# Patient Record
Sex: Female | Born: 1975 | Hispanic: No | Marital: Single | Smoking: Never smoker
Health system: Southern US, Community
[De-identification: ages and names within clinical notes are randomized; demographics above are authoritative.]

## PROBLEM LIST (undated history)

## (undated) DIAGNOSIS — J45909 Unspecified asthma, uncomplicated: Secondary | ICD-10-CM

## (undated) DIAGNOSIS — E119 Type 2 diabetes mellitus without complications: Secondary | ICD-10-CM

## (undated) DIAGNOSIS — K760 Fatty (change of) liver, not elsewhere classified: Secondary | ICD-10-CM

## (undated) HISTORY — DX: Type 2 diabetes mellitus without complications: E11.9

## (undated) HISTORY — DX: Fatty (change of) liver, not elsewhere classified: K76.0

---

## 1997-05-22 ENCOUNTER — Inpatient Hospital Stay (HOSPITAL_COMMUNITY): Admission: EM | Admit: 1997-05-22 | Discharge: 1997-05-22 | Payer: Self-pay | Admitting: Obstetrics

## 1998-01-10 ENCOUNTER — Emergency Department (HOSPITAL_COMMUNITY): Admission: EM | Admit: 1998-01-10 | Discharge: 1998-01-11 | Payer: Self-pay | Admitting: Emergency Medicine

## 1998-01-13 ENCOUNTER — Emergency Department (HOSPITAL_COMMUNITY): Admission: EM | Admit: 1998-01-13 | Discharge: 1998-01-13 | Payer: Self-pay | Admitting: Emergency Medicine

## 1998-09-08 ENCOUNTER — Ambulatory Visit (HOSPITAL_COMMUNITY): Admission: RE | Admit: 1998-09-08 | Discharge: 1998-09-08 | Payer: Self-pay | Admitting: Gynecology

## 1998-10-01 ENCOUNTER — Other Ambulatory Visit: Admission: RE | Admit: 1998-10-01 | Discharge: 1998-10-01 | Payer: Self-pay | Admitting: Gynecology

## 1999-07-20 ENCOUNTER — Emergency Department (HOSPITAL_COMMUNITY): Admission: EM | Admit: 1999-07-20 | Discharge: 1999-07-20 | Payer: Self-pay | Admitting: Emergency Medicine

## 1999-07-20 ENCOUNTER — Encounter: Payer: Self-pay | Admitting: Emergency Medicine

## 1999-08-28 ENCOUNTER — Other Ambulatory Visit: Admission: RE | Admit: 1999-08-28 | Discharge: 1999-08-28 | Payer: Self-pay | Admitting: Gynecology

## 1999-10-27 ENCOUNTER — Encounter: Admission: RE | Admit: 1999-10-27 | Discharge: 1999-10-27 | Payer: Self-pay | Admitting: Family Medicine

## 1999-11-06 ENCOUNTER — Encounter: Admission: RE | Admit: 1999-11-06 | Discharge: 2000-02-04 | Payer: Self-pay | Admitting: Internal Medicine

## 2000-02-10 ENCOUNTER — Inpatient Hospital Stay (HOSPITAL_COMMUNITY): Admission: AD | Admit: 2000-02-10 | Discharge: 2000-02-12 | Payer: Self-pay | Admitting: *Deleted

## 2000-03-25 ENCOUNTER — Other Ambulatory Visit: Admission: RE | Admit: 2000-03-25 | Discharge: 2000-03-25 | Payer: Self-pay | Admitting: Gynecology

## 2001-03-31 ENCOUNTER — Other Ambulatory Visit: Admission: RE | Admit: 2001-03-31 | Discharge: 2001-03-31 | Payer: Self-pay | Admitting: Gynecology

## 2001-10-06 ENCOUNTER — Other Ambulatory Visit: Admission: RE | Admit: 2001-10-06 | Discharge: 2001-10-06 | Payer: Self-pay | Admitting: Obstetrics and Gynecology

## 2002-04-12 ENCOUNTER — Other Ambulatory Visit: Admission: RE | Admit: 2002-04-12 | Discharge: 2002-04-12 | Payer: Self-pay | Admitting: Gynecology

## 2003-09-19 ENCOUNTER — Emergency Department (HOSPITAL_COMMUNITY): Admission: EM | Admit: 2003-09-19 | Discharge: 2003-09-19 | Payer: Self-pay | Admitting: Emergency Medicine

## 2011-09-22 ENCOUNTER — Ambulatory Visit
Admission: RE | Admit: 2011-09-22 | Discharge: 2011-09-22 | Disposition: A | Discharge status: Home / Self Care | Payer: No Typology Code available for payment source | Source: Ambulatory Visit | Attending: Geriatric Medicine | Admitting: Geriatric Medicine

## 2011-09-22 ENCOUNTER — Other Ambulatory Visit: Payer: Self-pay | Admitting: Geriatric Medicine

## 2011-09-22 DIAGNOSIS — M79673 Pain in unspecified foot: Secondary | ICD-10-CM

## 2018-09-11 ENCOUNTER — Other Ambulatory Visit: Payer: Self-pay

## 2018-09-11 ENCOUNTER — Inpatient Hospital Stay (HOSPITAL_COMMUNITY)
Admission: EM | Admit: 2018-09-11 | Discharge: 2018-09-15 | DRG: 177 | Disposition: A | Discharge status: Home / Self Care | Payer: HRSA Program | Attending: Internal Medicine | Admitting: Internal Medicine

## 2018-09-11 ENCOUNTER — Encounter (HOSPITAL_COMMUNITY): Payer: Self-pay | Admitting: Emergency Medicine

## 2018-09-11 ENCOUNTER — Emergency Department (HOSPITAL_COMMUNITY): Payer: HRSA Program

## 2018-09-11 DIAGNOSIS — A419 Sepsis, unspecified organism: Secondary | ICD-10-CM

## 2018-09-11 DIAGNOSIS — R739 Hyperglycemia, unspecified: Secondary | ICD-10-CM | POA: Diagnosis not present

## 2018-09-11 DIAGNOSIS — J9601 Acute respiratory failure with hypoxia: Secondary | ICD-10-CM | POA: Diagnosis present

## 2018-09-11 DIAGNOSIS — J1289 Other viral pneumonia: Secondary | ICD-10-CM | POA: Diagnosis not present

## 2018-09-11 DIAGNOSIS — E1165 Type 2 diabetes mellitus with hyperglycemia: Secondary | ICD-10-CM | POA: Diagnosis present

## 2018-09-11 DIAGNOSIS — J1282 Pneumonia due to coronavirus disease 2019: Secondary | ICD-10-CM | POA: Diagnosis present

## 2018-09-11 DIAGNOSIS — J45909 Unspecified asthma, uncomplicated: Secondary | ICD-10-CM | POA: Diagnosis not present

## 2018-09-11 DIAGNOSIS — Z8709 Personal history of other diseases of the respiratory system: Secondary | ICD-10-CM | POA: Diagnosis not present

## 2018-09-11 DIAGNOSIS — U071 COVID-19: Principal | ICD-10-CM | POA: Diagnosis present

## 2018-09-11 DIAGNOSIS — R6510 Systemic inflammatory response syndrome (SIRS) of non-infectious origin without acute organ dysfunction: Secondary | ICD-10-CM | POA: Diagnosis not present

## 2018-09-11 HISTORY — DX: Unspecified asthma, uncomplicated: J45.909

## 2018-09-11 LAB — CBG MONITORING, ED: Glucose-Capillary: 184 mg/dL — ABNORMAL HIGH (ref 70–99)

## 2018-09-11 LAB — C-REACTIVE PROTEIN
CRP: 17.1 mg/dL — ABNORMAL HIGH (ref <1.0)
CRP: 15.6 mg/dL — ABNORMAL HIGH (ref <1.0)

## 2018-09-11 LAB — PROCALCITONIN
Procalcitonin: 0.23 ng/mL
Procalcitonin: 0.17 ng/mL

## 2018-09-11 LAB — CBC
HCT: 40 % (ref 36.0–46.0)
Hemoglobin: 13.1 g/dL (ref 12.0–15.0)
MCH: 29.8 pg (ref 26.0–34.0)
MCHC: 32.8 g/dL (ref 30.0–36.0)
MCV: 91.1 fL (ref 80.0–100.0)
Platelets: 276 10*3/uL (ref 150–400)
RBC: 4.39 MIL/uL (ref 3.87–5.11)
RDW: 13.5 % (ref 11.5–15.5)
WBC: 10.9 10*3/uL — ABNORMAL HIGH (ref 4.0–10.5)
nRBC: 0 % (ref 0.0–0.2)

## 2018-09-11 LAB — I-STAT BETA HCG BLOOD, ED (MC, WL, AP ONLY): I-stat hCG, quantitative: <5 m[IU]/mL (ref <5)

## 2018-09-11 LAB — TYPE AND SCREEN
ABO/RH(D): B POS
Antibody Screen: NEGATIVE

## 2018-09-11 LAB — ABO/RH: ABO/RH(D): B POS

## 2018-09-11 LAB — BASIC METABOLIC PANEL
Anion gap: 12 (ref 5–15)
BUN: 7 mg/dL (ref 6–20)
CO2: 27 mmol/L (ref 22–32)
Calcium: 8.6 mg/dL — ABNORMAL LOW (ref 8.9–10.3)
Chloride: 97 mmol/L — ABNORMAL LOW (ref 98–111)
Creatinine, Ser: 0.64 mg/dL (ref 0.44–1.00)
GFR calc Af Amer: >60 mL/min (ref >60)
GFR calc non Af Amer: >60 mL/min (ref >60)
Glucose, Bld: 176 mg/dL — ABNORMAL HIGH (ref 70–99)
Potassium: 4.3 mmol/L (ref 3.5–5.1)
Sodium: 136 mmol/L (ref 135–145)

## 2018-09-11 LAB — TRIGLYCERIDES
Triglycerides: 91 mg/dL (ref <150)
Triglycerides: 89 mg/dL (ref <150)

## 2018-09-11 LAB — SEDIMENTATION RATE: Sed Rate: 116 mm/hr — ABNORMAL HIGH (ref 0–22)

## 2018-09-11 LAB — LACTATE DEHYDROGENASE: LDH: 328 U/L — ABNORMAL HIGH (ref 98–192)

## 2018-09-11 LAB — LACTIC ACID, PLASMA
Lactic Acid, Venous: 1.8 mmol/L (ref 0.5–1.9)
Lactic Acid, Venous: 1.2 mmol/L (ref 0.5–1.9)

## 2018-09-11 LAB — TROPONIN I (HIGH SENSITIVITY)
Troponin I (High Sensitivity): 5 ng/L (ref <18)
Troponin I (High Sensitivity): 5 ng/L (ref <18)

## 2018-09-11 LAB — FIBRINOGEN
Fibrinogen: >800 mg/dL — ABNORMAL HIGH (ref 210–475)
Fibrinogen: >800 mg/dL — ABNORMAL HIGH (ref 210–475)

## 2018-09-11 LAB — BRAIN NATRIURETIC PEPTIDE: B Natriuretic Peptide: 45.1 pg/mL (ref 0.0–100.0)

## 2018-09-11 LAB — SARS CORONAVIRUS 2 BY RT PCR (HOSPITAL ORDER, PERFORMED IN ~~LOC~~ HOSPITAL LAB): SARS Coronavirus 2: POSITIVE — AB

## 2018-09-11 LAB — D-DIMER, QUANTITATIVE
D-Dimer, Quant: 0.89 ug/mL-FEU — ABNORMAL HIGH (ref 0.00–0.50)
D-Dimer, Quant: 0.68 ug/mL-FEU — ABNORMAL HIGH (ref 0.00–0.50)

## 2018-09-11 LAB — FERRITIN: Ferritin: 390 ng/mL — ABNORMAL HIGH (ref 11–307)

## 2018-09-11 LAB — GLUCOSE, CAPILLARY: Glucose-Capillary: 210 mg/dL — ABNORMAL HIGH (ref 70–99)

## 2018-09-11 MED ORDER — GUAIFENESIN-DM 100-10 MG/5ML PO SYRP
10.0000 mL | ORAL_SOLUTION | ORAL | Status: DC | PRN
  Administered 2018-09-11: 10 mL via ORAL
  Filled 2018-09-11: qty 10

## 2018-09-11 MED ORDER — ALBUTEROL SULFATE HFA 108 (90 BASE) MCG/ACT IN AERS
2.0000 | INHALATION_SPRAY | Freq: Once | RESPIRATORY_TRACT | Status: AC
  Administered 2018-09-11: 2 via RESPIRATORY_TRACT

## 2018-09-11 MED ORDER — VITAMIN C 500 MG PO TABS
500.0000 mg | ORAL_TABLET | Freq: Every day | ORAL | Status: DC
  Administered 2018-09-12 – 2018-09-15 (×4): 500 mg via ORAL
  Filled 2018-09-11 (×4): qty 1

## 2018-09-11 MED ORDER — SODIUM CHLORIDE 0.9 % IV SOLN
1.0000 g | Freq: Once | INTRAVENOUS | Status: AC
  Administered 2018-09-11: 1 g via INTRAVENOUS
  Filled 2018-09-11: qty 10

## 2018-09-11 MED ORDER — ALBUTEROL SULFATE HFA 108 (90 BASE) MCG/ACT IN AERS
2.0000 | INHALATION_SPRAY | Freq: Four times a day (QID) | RESPIRATORY_TRACT | Status: DC
  Administered 2018-09-11: 2 via RESPIRATORY_TRACT
  Filled 2018-09-11: qty 6.7

## 2018-09-11 MED ORDER — SODIUM CHLORIDE 0.9 % IV SOLN
200.0000 mg | Freq: Once | INTRAVENOUS | Status: AC
  Administered 2018-09-11: 200 mg via INTRAVENOUS
  Filled 2018-09-11: qty 40

## 2018-09-11 MED ORDER — SODIUM CHLORIDE 0.9 % IV SOLN
500.0000 mg | Freq: Once | INTRAVENOUS | Status: AC
  Administered 2018-09-11: 500 mg via INTRAVENOUS
  Filled 2018-09-11: qty 500

## 2018-09-11 MED ORDER — ENOXAPARIN SODIUM 40 MG/0.4ML ~~LOC~~ SOLN
40.0000 mg | SUBCUTANEOUS | Status: DC
  Administered 2018-09-12 – 2018-09-15 (×4): 40 mg via SUBCUTANEOUS
  Filled 2018-09-11 (×3): qty 0.4

## 2018-09-11 MED ORDER — SODIUM CHLORIDE 0.9 % IV SOLN
100.0000 mg | INTRAVENOUS | Status: AC
  Administered 2018-09-12 – 2018-09-15 (×4): 100 mg via INTRAVENOUS
  Filled 2018-09-11 (×4): qty 20

## 2018-09-11 MED ORDER — ALBUTEROL SULFATE HFA 108 (90 BASE) MCG/ACT IN AERS
INHALATION_SPRAY | RESPIRATORY_TRACT | Status: AC
  Administered 2018-09-11: 09:00:00
  Filled 2018-09-11: qty 6.7

## 2018-09-11 MED ORDER — SODIUM CHLORIDE 0.9% FLUSH: 3.0000 mL | Freq: Once | INTRAVENOUS | Status: DC

## 2018-09-11 MED ORDER — HYDROCOD POLST-CPM POLST ER 10-8 MG/5ML PO SUER: 5.0000 mL | Freq: Two times a day (BID) | ORAL | Status: DC | PRN

## 2018-09-11 MED ORDER — INSULIN ASPART 100 UNIT/ML ~~LOC~~ SOLN
0.0000 [IU] | Freq: Three times a day (TID) | SUBCUTANEOUS | Status: DC
  Administered 2018-09-11: 2 [IU] via SUBCUTANEOUS

## 2018-09-11 MED ORDER — ONDANSETRON HCL 4 MG/2ML IJ SOLN: 4.0000 mg | Freq: Four times a day (QID) | INTRAMUSCULAR | Status: DC | PRN

## 2018-09-11 MED ORDER — SODIUM CHLORIDE 0.9% FLUSH
3.0000 mL | Freq: Two times a day (BID) | INTRAVENOUS | Status: DC
  Administered 2018-09-11: 3 mL via INTRAVENOUS

## 2018-09-11 MED ORDER — IPRATROPIUM BROMIDE HFA 17 MCG/ACT IN AERS
2.0000 | INHALATION_SPRAY | Freq: Once | RESPIRATORY_TRACT | Status: AC
  Administered 2018-09-11: 2 via RESPIRATORY_TRACT
  Filled 2018-09-11: qty 12.9

## 2018-09-11 MED ORDER — FAMOTIDINE 20 MG PO TABS
20.0000 mg | ORAL_TABLET | Freq: Two times a day (BID) | ORAL | Status: DC
  Administered 2018-09-11 – 2018-09-15 (×9): 20 mg via ORAL
  Filled 2018-09-11 (×9): qty 1

## 2018-09-11 MED ORDER — IPRATROPIUM-ALBUTEROL 20-100 MCG/ACT IN AERS
1.0000 | INHALATION_SPRAY | Freq: Four times a day (QID) | RESPIRATORY_TRACT | Status: DC
  Administered 2018-09-11 – 2018-09-12 (×2): 1 via RESPIRATORY_TRACT
  Filled 2018-09-11: qty 4

## 2018-09-11 MED ORDER — ONDANSETRON HCL 4 MG PO TABS: 4.0000 mg | ORAL_TABLET | Freq: Four times a day (QID) | ORAL | Status: DC | PRN

## 2018-09-11 MED ORDER — INSULIN ASPART 100 UNIT/ML ~~LOC~~ SOLN
0.0000 [IU] | SUBCUTANEOUS | Status: DC
  Administered 2018-09-11: 5 [IU] via SUBCUTANEOUS
  Administered 2018-09-12: 3 [IU] via SUBCUTANEOUS

## 2018-09-11 MED ORDER — SODIUM CHLORIDE 0.9 % IV SOLN
Freq: Once | INTRAVENOUS | Status: AC
  Administered 2018-09-11: 12:00:00 via INTRAVENOUS

## 2018-09-11 MED ORDER — ZINC SULFATE 220 (50 ZN) MG PO CAPS
220.0000 mg | ORAL_CAPSULE | Freq: Every day | ORAL | Status: DC
  Administered 2018-09-11 – 2018-09-15 (×5): 220 mg via ORAL
  Filled 2018-09-11 (×5): qty 1

## 2018-09-11 MED ORDER — ALBUTEROL SULFATE HFA 108 (90 BASE) MCG/ACT IN AERS
8.0000 | INHALATION_SPRAY | Freq: Once | RESPIRATORY_TRACT | Status: AC
  Administered 2018-09-11: 8 via RESPIRATORY_TRACT

## 2018-09-11 MED ORDER — DEXAMETHASONE SODIUM PHOSPHATE 10 MG/ML IJ SOLN
6.0000 mg | INTRAMUSCULAR | Status: AC
  Administered 2018-09-11: 6 mg via INTRAVENOUS
  Filled 2018-09-11: qty 1

## 2018-09-11 MED ORDER — TOCILIZUMAB 400 MG/20ML IV SOLN
8.0000 mg/kg | Freq: Once | INTRAVENOUS | Status: AC
  Administered 2018-09-11: 730 mg via INTRAVENOUS
  Filled 2018-09-11: qty 36.5

## 2018-09-11 MED ORDER — MONTELUKAST SODIUM 10 MG PO TABS
10.0000 mg | ORAL_TABLET | Freq: Every day | ORAL | Status: DC
  Administered 2018-09-11 – 2018-09-14 (×4): 10 mg via ORAL
  Filled 2018-09-11 (×4): qty 1

## 2018-09-11 MED ORDER — METHYLPREDNISOLONE SODIUM SUCC 125 MG IJ SOLR
60.0000 mg | Freq: Three times a day (TID) | INTRAMUSCULAR | Status: DC
  Administered 2018-09-11 – 2018-09-12 (×2): 60 mg via INTRAVENOUS
  Filled 2018-09-11 (×2): qty 2

## 2018-09-11 NOTE — ED Notes (Signed)
ED Provider at bedside. 

## 2018-09-11 NOTE — ED Triage Notes (Signed)
Pt here for eval of tightness to chest with breathing, hx of asthma. Afebrile, denies exposure to covid. Pt 86% on room air, using her inhaler with no relief. Placed on 2L O2. Endorses cough and SOB.

## 2018-09-11 NOTE — ED Provider Notes (Signed)
[facility] EMERGENCY DEPARTMENT Provider Note   CSN: [id number] Arrival date & time: 09/11/18  0840     History   Chief Complaint Chief Complaint  Patient presents with  . Chest Pain  . Shortness of Breath    HPI Randall Laiyla Slagel is a 43 y.o. female.     HPI  43 year old female presents with shortness of breath and cough.  Symptoms overall started about a week ago.  Cough is a dry cough and it causes anterior chest pain when she coughs.  She states she was told last year she might have asthma and thinks this might be similar.  She has not heard any wheezing.  A few days ago she went somewhere else and was given what sounds like a nebulizer but it only partially helped.  She feels like her legs have been a little swollen.  She denies any sick contacts. On arrival to triage she is hypoxic to 86%.  Past Medical History:  Diagnosis Date  . Asthma     There are no active problems to display for this patient.      OB History   No obstetric history on file.      Home Medications    Prior to Admission medications   Not on File    Family History No family history on file.  Social History Social History   Tobacco Use  . Smoking status: Not on file  Substance Use Topics  . Alcohol use: Not on file  . Drug use: Not on file     Allergies   Patient has no known allergies.   Review of Systems Review of Systems  Constitutional: Negative for fever.  Respiratory: Positive for cough and shortness of breath. Negative for wheezing.   Cardiovascular: Positive for chest pain and leg swelling.  Gastrointestinal: Negative for vomiting.  All other systems reviewed and are negative.    Physical Exam Updated Vital Signs BP 134/72   Pulse 84   Temp 98.6 F (37 C)   Resp (!) 24   SpO2 (!) 86%   Physical Exam Vitals signs and nursing note reviewed.  Constitutional:      Appearance: She is well-developed. She is not ill-appearing or  diaphoretic.  HENT:     Head: Normocephalic and atraumatic.     Right Ear: External ear normal.     Left Ear: External ear normal.     Nose: Nose normal.  Eyes:     General:        Right eye: No discharge.        Left eye: No discharge.  Cardiovascular:     Rate and Rhythm: Regular rhythm. Tachycardia present.     Heart sounds: Normal heart sounds.  Pulmonary:     Effort: Tachypnea present. No accessory muscle usage or respiratory distress.     Breath sounds: Examination of the right-lower field reveals rales. Examination of the left-lower field reveals rales. Rales present.  Abdominal:     Palpations: Abdomen is soft.     Tenderness: There is no abdominal tenderness.  Musculoskeletal:     Right lower leg: No edema.     Left lower leg: No edema.  Skin:    General: Skin is warm and dry.  Neurological:     Mental Status: She is alert.  Psychiatric:        Mood and Affect: Mood is not anxious.      ED Treatments / Results  Labs (all  labs ordered are listed, but only abnormal results are displayed) Labs Reviewed  SARS CORONAVIRUS 2 (HOSPITAL ORDER, PERFORMED IN [facility]) - Abnormal; Notable for the following components:      Result Value   SARS Coronavirus 2 POSITIVE (*)    All other components within normal limits  BASIC METABOLIC PANEL - Abnormal; Notable for the following components:   Chloride 97 (*)    Glucose, Bld 176 (*)    Calcium 8.6 (*)    All other components within normal limits  CBC - Abnormal; Notable for the following components:   WBC 10.9 (*)    All other components within normal limits  CULTURE, BLOOD (ROUTINE X 2)  CULTURE, BLOOD (ROUTINE X 2)  EXPECTORATED SPUTUM ASSESSMENT W REFEX TO RESP CULTURE  BRAIN NATRIURETIC PEPTIDE  LACTIC ACID, PLASMA  LACTIC ACID, PLASMA  D-DIMER, QUANTITATIVE (NOT AT [facility])  PROCALCITONIN  FERRITIN  TRIGLYCERIDES  FIBRINOGEN  C-REACTIVE PROTEIN  LACTATE DEHYDROGENASE  HIV ANTIBODY (ROUTINE TESTING  W REFLEX)  C-REACTIVE PROTEIN  D-DIMER, QUANTITATIVE (NOT AT [facility])  FIBRINOGEN  GLUCOSE 6 PHOSPHATE DEHYDROGENASE  PROCALCITONIN  TRIGLYCERIDES  SEDIMENTATION RATE  LACTATE DEHYDROGENASE  INTERLEUKIN-6, PLASMA  HEPATITIS B SURFACE ANTIGEN  LEGIONELLA PNEUMOPHILA SEROGP 1 UR AG  STREP PNEUMONIAE URINARY ANTIGEN  I-STAT BETA HCG BLOOD, ED ([facility], [facility], [facility] ONLY)  ABO/RH  TYPE AND SCREEN  TROPONIN I (HIGH SENSITIVITY)  TROPONIN I (HIGH SENSITIVITY)    EKG EKG Interpretation  Date/Time:  Monday September 11 2018 08:50:29 EDT Ventricular Rate:  103 PR Interval:  142 QRS Duration: 70 QT Interval:  332 QTC Calculation: 434 R Axis:   56 Text Interpretation:  Sinus tachycardia no acute ST/T changes No old tracing to compare Confirmed by Pricilla LovelessGoldston, Khanh Cordner [phone]([id number]) on 09/11/2018 9:01:22 AM   Radiology Dg Chest Portable 1 View  Result Date: 09/11/2018 CLINICAL DATA:  Hypoxia.  Chest tightness.  History of asthma. EXAM: PORTABLE CHEST 1 VIEW COMPARISON:  None. FINDINGS: Mild patchy opacities in the bases, left greater than right. No pneumothorax. The cardiomediastinal silhouette is unremarkable. No other acute abnormalities. IMPRESSION: Mild patchy opacities in the bases, left greater than right, may represent developing pneumonia. Atypical infections should be considered. No other acute abnormalities are identified. Electronically Signed   By: Gerome Samavid  Williams III M.D   On: 09/11/2018 09:41    Procedures Procedures (including critical care time)  Medications Ordered in ED Medications  sodium chloride flush (NS) 0.9 % injection 3 mL (3 mLs Intravenous Not Given 09/11/18 1031)  azithromycin (ZITHROMAX) 500 mg in sodium chloride 0.9 % 250 mL IVPB (500 mg Intravenous New Bag/Given 09/11/18 1043)  montelukast (SINGULAIR) tablet 10 mg (has no administration in time range)  enoxaparin (LOVENOX) injection 40 mg (has no administration in time range)  sodium chloride flush (NS) 0.9 % injection 3 mL (has no  administration in time range)  0.9 %  sodium chloride infusion (has no administration in time range)  albuterol (VENTOLIN HFA) 108 (90 Base) MCG/ACT inhaler 2 puff (has no administration in time range)  guaiFENesin-dextromethorphan (ROBITUSSIN DM) 100-10 MG/5ML syrup 10 mL (has no administration in time range)  chlorpheniramine-HYDROcodone (TUSSIONEX) 10-8 MG/5ML suspension 5 mL (has no administration in time range)  vitamin C (ASCORBIC ACID) tablet 500 mg (has no administration in time range)  zinc sulfate capsule 220 mg (has no administration in time range)  famotidine (PEPCID) tablet 20 mg (has no administration in time range)  ondansetron (ZOFRAN) tablet 4 mg (has  no administration in time range)    Or  ondansetron (ZOFRAN) injection 4 mg (has no administration in time range)  albuterol (VENTOLIN HFA) 108 (90 Base) MCG/ACT inhaler 2 puff (2 puffs Inhalation Given 09/11/18 0858)  albuterol (VENTOLIN HFA) 108 (90 Base) MCG/ACT inhaler (  Given 09/11/18 0911)  albuterol (VENTOLIN HFA) 108 (90 Base) MCG/ACT inhaler 8 puff (8 puffs Inhalation Given 09/11/18 0914)  ipratropium (ATROVENT HFA) inhaler 2 puff (2 puffs Inhalation Given 09/11/18 1030)  cefTRIAXone (ROCEPHIN) 1 g in sodium chloride 0.9 % 100 mL IVPB (0 g Intravenous Stopped 09/11/18 1042)     Initial Impression / Assessment and Plan / ED Course  I have reviewed the triage vital signs and the nursing notes.  Pertinent labs & imaging results that were available during my care of the patient were reviewed by me and considered in my medical decision making (see chart for details).        Patient test positive for the novel coronavirus.  This explains her hypoxia and chest x-ray findings.  Prior to this test coming back, she was given antibiotics for possible pneumonia but it appears that she has a viral pneumonia and these antibiotics can likely be stopped.  Otherwise, she is tachypneic but not in distress.  She is on supplemental oxygen  but is protecting her airway and does not need further airway management at this time.  Discussed with Dr. Katrinka BlazingSmith, who will admit.  Lynford CitizenReyna Gonzalez Eves was evaluated in Emergency Department on 09/11/2018 for the symptoms described in the history of present illness. She was evaluated in the context of the global COVID-19 pandemic, which necessitated consideration that the patient might be at risk for infection with the SARS-CoV-2 virus that causes COVID-19. Institutional protocols and algorithms that pertain to the evaluation of patients at risk for COVID-19 are in a state of rapid change based on information released by regulatory bodies including the CDC and federal and state organizations. These policies and algorithms were followed during the patient's care in the ED.   Final Clinical Impressions(s) / ED Diagnoses   Final diagnoses:  COVID-19 virus infection  Acute respiratory failure with hypoxia [facility](HCC)    ED Discharge Orders    None       Pricilla LovelessGoldston, Angeletta Goelz, MD 09/11/18 1143

## 2018-09-11 NOTE — ED Notes (Signed)
Pt's CBG result was 184. Informed Cindy - RN.

## 2018-09-11 NOTE — Progress Notes (Signed)
Ruth Fox DOB: 11-17-1975 DOA: 09/11/2018 PCP: Patient, No Pcp Per   [place]:  Ruth Fox is a 43 y.o HF PMHx Asthma; diabetes type 2 uncontrolled with complication  Presents with 1 week of progressively worsening cough and shortness of breath.  Her cough has been mostly nonproductive.  Couple days ago she went somewhere and possibly received a nebulized treatment which helped some with the symptoms.  Only other associated symptoms include some malaise, chest discomfort with coughing, and some mild lower extremity swelling.  Denies having any significant fever, chills, nausea, vomiting, diarrhea, headache, muscle aches.  She does not report any recent sick contacts to her knowledge.  She reports a history of asthma, but reports not being on any inhalers at home.  She is unsure why she was started on metformin.   ED Course: On admission into the emergency department patient was noted to be afebrile, respirations up to 38, blood pressure 102/59-134/72, SPO2  86% on room air and improved to greater than 92% on 2 L nasal cannula oxygen, and heart rates maintained. Labs revealed WBC 10.9, lactic acid 1.8, high-sensitivity troponin negative, and BNP 45.  Chest x-ray showed signs of bilateral opacities concerning for pneumonia.  COVID-19 screening was positive.  Patient was given albuterol, ipratropium, empiric antibiotics of Rocephin, and azithromycin. [facility] called to admit.  States lives with her husband and her child neither which have been tested for COVID. - Negative history of hepatitis or TB  Obj: Objective: VITAL SIGNS: Temp: 98.7 F (37.1 C) (07/20 1900) Temp Source: Oral (07/20 1900) BP: 109/61 (07/20 2145) Pulse Rate: 95 (07/20 2145) SPO2; 95% FIO2: 5 L O2   Intake/Output Summary (Last 24 hours) at 09/11/2018 2211 Last data filed at 09/11/2018 1510 Gross per 24 hour  Intake 250 ml  Output -  Net 250 ml     Exam: General: A/O x4, positive acute  respiratory distress Lungs: Clear to auscultation bilaterally, bibasilar crackles, negative wheezes Cardiovascular: Regular rate and rhythm without murmur gallop or rub normal S1 and S2 Abdomen: Obese, nontender, nondistended, soft, bowel sounds positive, no rebound, no ascites, no appreciable mass Extremities: No significant cyanosis, clubbing, or edema bilateral lower extremities Skin: Negative rashes, lesions, ulcers Psychiatric:  Negative depression, negative anxiety, negative fatigue, negative mania  Central nervous system:  Cranial nerves II through XII intact, tongue/uvula midline, all extremities muscle strength 5/5, sensation intact throughout,  negative dysarthria, negative expressive aphasia, negative receptive aphasia.  .     Procedure/Significant Events: 7/20 PCXR, bibasilar patchy opacifications LEFT> RIGHT 7/20 urine hCG pending   I have personally reviewed and interpreted all radiology studies and my findings are as above.   Culture 7/20 urine Legionella and strep urine antigen pending 7/20 blood pending 7/20 respiratory virus panel pending 7/20 SARS coronavirus positive    Antibiotics: Anti-infectives (From admission, onward)   Start     Stop   09/12/18 1000  remdesivir 100 mg in sodium chloride 0.9 % 250 mL IVPB     09/16/18 0959   09/11/18 1400  remdesivir 200 mg in sodium chloride 0.9 % 250 mL IVPB     09/11/18 1510   09/11/18 1000  cefTRIAXone (ROCEPHIN) 1 g in sodium chloride 0.9 % 100 mL IVPB     09/11/18 1042   09/11/18 1000  azithromycin (ZITHROMAX) 500 mg in sodium chloride 0.9 % 250 mL IVPB     09/11/18 1158        Principal Problem:   Acute  respiratory failure with hypoxia (HCC) Active Problems:   Pneumonia due to COVID-19 virus   Sepsis (HCC)   History of asthma   Hyperglycemia   A/P  Acute respiratory failure with hypoxia/COVID 19 pneumonia positive Recent Labs  Lab 09/11/18 0916 09/11/18 1215  CRP 17.1* 15.6*   Recent Labs   Lab 09/11/18 0916 09/11/18 1215  DDIMER 0.89* 0.68*  - Patient not on home O2 - 7/20 currently on 5 L O2 via [place]: SPO2 93%.  Titrate O2 to maintain SPO2> 90% - Given patient's PCXR and new O2 demand patient meets criteria for high-dose steroids, and Remdesivir per pharmacy  -Solu-Medrol 60 mg every 8 hours - Given patient's PCXR and CRP> 7 patient meets criteria for Actemra - COVID daily inflammatory markers ordered  Sepsis - Upon admission patient DOES NOT meet criteria for sepsis.  Although clearly ill - Patient pancultured, pending  Asthma - Patient reports history of asthma however not on inhalers at home - Singulair 10 mg nightly (home med)  Diabetes type 2 uncontrolled with complication - No hemoglobin A1c on file.  Hemoglobin A1c pending -Lipid panel pending - Hold metformin - Moderate SSI secondary to high-dose steroids           Care during the described time interval was provided by me .  I have reviewed this patient's available data, including medical history, events of note, physical examination, and all test results as part of my evaluation.  Time spent; 30 minutes

## 2018-09-11 NOTE — H&P (Addendum)
History and Physical    Ruth CitizenReyna Fox Viviani ZOX:[id number] DOB: 10/11/1975 DOA: 09/11/2018  Referring MD/NP/PA: Pricilla LovelessScott Goldston, MD PCP: Patient, No Pcp Per  Patient coming from: home  Chief Complaint: cough and shortness of breath  I have personally briefly reviewed patient's old medical [facility]   HPI: Ruth Fox is a 43 y.o. female with medical history significant of asthma; who presents with 1 week of progressively worsening cough and shortness of breath.  Her cough has been mostly nonproductive.  Couple days ago she went somewhere and possibly received a nebulized treatment which helped some with the symptoms.  Only other associated symptoms include some malaise, chest discomfort with coughing, and some mild lower extremity swelling.  Denies having any significant fever, chills, nausea, vomiting, diarrhea, headache, muscle aches.  She does not report any recent sick contacts to her knowledge.  She reports a history of asthma, but reports not being on any inhalers at home.  She is unsure why she was started on metformin.  ED Course: On admission into the emergency department patient was noted to be afebrile, respirations up to 38, blood pressure 102/59-134/72, O2 saturations noted as low as 86% on room air and improved to greater than 92% on 2 L nasal cannula oxygen, and heart rates maintained. Labs revealed WBC 10.9, lactic acid 1.8, high-sensitivity troponin negative, and BNP 45.  Chest x-ray showed signs of bilateral opacities concerning for pneumonia.  COVID-19 screening was positive.  Patient was given albuterol, ipratropium, empiric antibiotics of Rocephin, and azithromycin.  TRH called to admit.  Review of Systems  Constitutional: Positive for malaise/fatigue. Negative for fever.  HENT: Negative for ear discharge and nosebleeds.   Eyes: Negative for photophobia and pain.  Respiratory: Positive for cough, sputum production and shortness of breath.  Negative for wheezing.   Cardiovascular: Positive for chest pain and leg swelling.  Gastrointestinal: Negative for abdominal pain, nausea and vomiting.  Genitourinary: Negative for dysuria and frequency.  Musculoskeletal: Negative for joint pain and myalgias.  Skin: Negative for itching and rash.  Neurological: Negative for focal weakness, loss of consciousness and headaches.  Psychiatric/Behavioral: Negative for substance abuse. The patient is not nervous/anxious.     Past Medical History:  Diagnosis Date   Asthma     History reviewed. No pertinent surgical history.   reports that she has never smoked. She has never used smokeless tobacco. She reports that she does not drink alcohol or use drugs.  No Known Allergies  History reviewed. No pertinent family history.  Prior to Admission medications   Medication Sig Start Date End Date Taking? Authorizing Provider  metFORMIN (GLUCOPHAGE) 500 MG tablet Take 500 mg by mouth 2 (two) times daily. 09/09/18   [provider]  montelukast (SINGULAIR) 10 MG tablet Take 10 mg by mouth at bedtime. 09/09/18   [provider]    Physical Exam:  Constitutional: Obese female NAD, calm, comfortable Vitals:   09/11/18 1000 09/11/18 1015 09/11/18 1030 09/11/18 1045  BP: 105/67 107/65 111/67 (!) 102/59  Pulse: 93 92 93 94  Resp: (!) 31 (!) 21 (!) 24 (!) 38  Temp:      SpO2: 97% 96% 97% 97%   Eyes: PERRL, lids and conjunctivae normal ENMT: Mucous membranes are moist. Posterior pharynx clear of any exudate or lesions.Normal dentition.  Neck: normal, supple, no masses, no thyromegaly Respiratory: clear to auscultation bilaterally, no wheezing, no crackles. Normal respiratory effort. No accessory muscle use.  Cardiovascular: Regular rate  and rhythm, no murmurs / rubs / gallops. No extremity edema. 2+ pedal pulses. No carotid bruits.  Abdomen: no tenderness, no masses palpated. No hepatosplenomegaly. Bowel sounds positive.    Musculoskeletal: no clubbing / cyanosis. No joint deformity upper and lower extremities. Good ROM, no contractures. Normal muscle tone.  Skin: no rashes, lesions, ulcers. No induration Neurologic: CN 2-12 grossly intact. Sensation intact, DTR normal. Strength 5/5 in all 4.  Psychiatric: Normal judgment and insight. Alert and oriented x 3. Normal mood.     Labs on Admission: I have personally reviewed following labs and imaging studies  CBC: Recent Labs  Lab 09/11/18 0857  WBC 10.9*  HGB 13.1  HCT 40.0  MCV 91.1  PLT 509   Basic Metabolic Panel: Recent Labs  Lab 09/11/18 0857  NA 136  K 4.3  CL 97*  CO2 27  GLUCOSE 176*  BUN 7  CREATININE 0.64  CALCIUM 8.6*   GFR: CrCl cannot be calculated (Unknown ideal weight.). Liver Function Tests: No results for input(s): AST, ALT, ALKPHOS, BILITOT, PROT, ALBUMIN in the last 168 hours. No results for input(s): LIPASE, AMYLASE in the last 168 hours. No results for input(s): AMMONIA in the last 168 hours. Coagulation Profile: No results for input(s): INR, PROTIME in the last 168 hours. Cardiac Enzymes: No results for input(s): CKTOTAL, CKMB, CKMBINDEX, TROPONINI in the last 168 hours. BNP (last 3 results) No results for input(s): PROBNP in the last 8760 hours. HbA1C: No results for input(s): HGBA1C in the last 72 hours. CBG: No results for input(s): GLUCAP in the last 168 hours. Lipid Profile: No results for input(s): CHOL, HDL, LDLCALC, TRIG, CHOLHDL, LDLDIRECT in the last 72 hours. Thyroid Function Tests: No results for input(s): TSH, T4TOTAL, FREET4, T3FREE, THYROIDAB in the last 72 hours. Anemia Panel: No results for input(s): VITAMINB12, FOLATE, FERRITIN, TIBC, IRON, RETICCTPCT in the last 72 hours. Urine analysis: No results found for: COLORURINE, APPEARANCEUR, LABSPEC, [place], GLUCOSEU, HGBUR, BILIRUBINUR, KETONESUR, PROTEINUR, UROBILINOGEN, NITRITE, LEUKOCYTESUR Sepsis Labs: Recent Results (from the past 240  hour(s))  SARS Coronavirus 2 (CEPHEID- Performed in [place] hospital lab), Hosp Order     Status: Abnormal   Collection Time: 09/11/18  9:16 AM   Specimen: Nasopharyngeal Swab  Result Value Ref Range Status   SARS Coronavirus 2 POSITIVE (A) NEGATIVE Final    Comment: RESULT CALLED TO, READ BACK BY AND VERIFIED WITH: DR. Regenia Skeeter, AT 1118 09/11/18 BY D. VANHOOK (NOTE) If result is NEGATIVE SARS-CoV-2 target nucleic acids are NOT DETECTED. The SARS-CoV-2 RNA is generally detectable in upper and lower  respiratory specimens during the acute phase of infection. The lowest  concentration of SARS-CoV-2 viral copies this assay can detect is 250  copies / mL. A negative result does not preclude SARS-CoV-2 infection  and should not be used as the sole basis for treatment or other  patient management decisions.  A negative result may occur with  improper specimen collection / handling, submission of specimen other  than nasopharyngeal swab, presence of viral mutation(s) within the  areas targeted by this assay, and inadequate number of viral copies  (<250 copies / mL). A negative result must be combined with clinical  observations, patient history, and epidemiological information. If result is POSITIVE SARS-CoV-2 target nucleic acids are DETE CTED. The SARS-CoV-2 RNA is generally detectable in upper and lower  respiratory specimens during the acute phase of infection.  Positive  results are indicative of active infection with SARS-CoV-2.  Clinical  correlation with  patient history and other diagnostic information is  necessary to determine patient infection status.  Positive results do  not rule out bacterial infection or co-infection with other viruses. If result is PRESUMPTIVE POSTIVE SARS-CoV-2 nucleic acids MAY BE PRESENT.   A presumptive positive result was obtained on the submitted specimen  and confirmed on repeat testing.  While 2019 novel coronavirus  (SARS-CoV-2) nucleic acids  may be present in the submitted sample  additional confirmatory testing may be necessary for epidemiological  and / or clinical management purposes  to differentiate between  SARS-CoV-2 and other Sarbecovirus currently known to infect humans.  If clinically indicated additional testing with an alternate test  methodology (LAB [id number]) is advised. The SARS-CoV-2 RNA is generally  detectable in upper and lower respiratory specimens during the acute  phase of infection. The expected result is Negative. Fact Sheet for Patients:  BoilerBrush.com.cyhttps://www.fda.gov/media/136312/download Fact Sheet for Healthcare Providers: https://pope.com/https://www.fda.gov/media/136313/download This test is not yet approved or cleared by the [place] FDA and has been authorized for detection and/or diagnosis of SARS-CoV-2 by FDA under an Emergency Use Authorization (EUA).  This EUA will remain in effect (meaning this test can be used) for the duration of the COVID-19 declaration under Section 564(b)(1) of the Act, 21 U.S.C. section 360bbb-3(b)(1), unless the authorization is terminated or revoked sooner. Performed at [facility], [address] [place], [place] [address]      Radiological Exams on Admission: Dg Chest Portable 1 View  Result Date: 09/11/2018 CLINICAL DATA:  Hypoxia.  Chest tightness.  History of asthma. EXAM: PORTABLE CHEST 1 VIEW COMPARISON:  None. FINDINGS: Mild patchy opacities in the bases, left greater than right. No pneumothorax. The cardiomediastinal silhouette is unremarkable. No other acute abnormalities. IMPRESSION: Mild patchy opacities in the bases, left greater than right, may represent developing pneumonia. Atypical infections should be considered. No other acute abnormalities are identified. Electronically Signed   By: Gerome Samavid  Williams III M.D   On: 09/11/2018 09:41    EKG: Independently reviewed.  Sinus tachycardia at 103 bpm.  Assessment/Plan Acute hypoxic respiratory failure secondary to  pneumonia due to COVID-19: Acute.  Patient presents with cough and shortness of breath.  Found to be COVID-19 positive with signs of bilateral infiltrates on chest x-ray.  Patient was initially given Rocephin and azithromycin in the ED. -Ad[facility] -Continuous pulse oximetry with nasal cannula oxygen -Follow-up pending inflammatory markers -Determine need to continue antibiotics -Albuterol inhaler every 6 hours -Decadron 6 mg IV x1 dose now -Antitussives as needed -Vitamin C and zinc -Daily monitoring of inflammatory markers -Remdesivir per pharmacy   Sepsis: Patient presented tachypneic with WBC elevated at 10.9.  Lactic acid reassuring at 1.8.  Source of infection COVID-19 signs of atypical pneumonia. -Follow-up blood and sputum cultures -Recheck CBC in a.m.   History of asthma: Patient reports a history of asthma but is not on any inhalers at home -Continue Singulair  Hyperglycemia: Patient reported to be on metformin.  On admission blood glucose elevated at 176.  No hemoglobin A1c on file. -Check hemoglobin A1c -Carb modified diet -Hold metformin -CBGs q. ICU with sensitive SSI  DVT prophylaxis: Lovenox Code Status: Full Family Communication: Updated husband over the phone. Disposition Plan: Possible discharge home in 2 to 3 days Consults called: None Admission status: Inpatient  Clydie Braunondell A Drina Jobst MD [facility] Pager [phone]   If 7PM-7AM, please contact night-coverage [email or web address] Password [email or web address]  09/11/2018, 11:44 AM

## 2018-09-11 NOTE — ED Notes (Addendum)
ED TO INPATIENT HANDOFF REPORT  ED Nurse Name and Phone #:   S Name/Age/Gender Ruth Fox 43 y.o. female Room/Bed: 032C/032C  Code Status   Code Status: Full Code  Home/SNF/Other Home Patient oriented to: self, place, time and situation Is this baseline? Yes   Triage Complete: Triage complete  Chief Complaint coughing,shob  Triage Note Pt here for eval of tightness to chest with breathing, hx of asthma. Afebrile, denies exposure to covid. Pt 86% on room air, using her inhaler with no relief. Placed on 2L O2. Endorses cough and SOB.     Allergies No Known Allergies  Level of Care/Admitting Diagnosis ED Disposition    ED Disposition Condition Comment   Admit  Hospital Area: [facility] [[id number]]  Level of Care: Telemetry [5]  Covid Evaluation: Confirmed COVID Positive  Diagnosis: Pneumonia due to COVID-19 virus [[id number]]  Admitting Physician: Clydie Braun [[id number]]  Attending Physician: Clydie Braun [[id number]]  Estimated length of stay: past midnight tomorrow  Certification:: I certify this patient will need inpatient services for at least 2 midnights  PT Class (Do Not Modify): Inpatient [101]  PT Acc Code (Do Not Modify): Private [1]       B Medical/Surgery History Past Medical History:  Diagnosis Date  . Asthma    History reviewed. No pertinent surgical history.   A IV Location/Drains/Wounds Patient Lines/Drains/Airways Status   Active Line/Drains/Airways    Name:   Placement date:   Placement time:   Site:   Days:   Peripheral IV 09/11/18 Left Antecubital   09/11/18    1010    Antecubital   less than 1          Intake/Output Last 24 hours  Intake/Output Summary (Last 24 hours) at 09/11/2018 1824 Last data filed at 09/11/2018 1510 Gross per 24 hour  Intake 250 ml  Output -  Net 250 ml    Labs/Imaging Results for orders placed or performed during the hospital encounter of 09/11/18 (from the past 48  hour(s))  Basic metabolic panel     Status: Abnormal   Collection Time: 09/11/18  8:57 AM  Result Value Ref Range   Sodium 136 135 - 145 mmol/L   Potassium 4.3 3.5 - 5.1 mmol/L   Chloride 97 (L) 98 - 111 mmol/L   CO2 27 22 - 32 mmol/L   Glucose, Bld 176 (H) 70 - 99 mg/dL   BUN 7 6 - 20 mg/dL   Creatinine, Ser 2.13 0.44 - 1.00 mg/dL   Calcium 8.6 (L) 8.9 - 10.3 mg/dL   GFR calc non Af Amer >60 >60 mL/min   GFR calc Af Amer >60 >60 mL/min   Anion gap 12 5 - 15    Comment: Performed at [facility], [address] [place], [place] [address]  CBC     Status: Abnormal   Collection Time: 09/11/18  8:57 AM  Result Value Ref Range   WBC 10.9 (H) 4.0 - 10.5 K/uL   RBC 4.39 3.87 - 5.11 MIL/uL   Hemoglobin 13.1 12.0 - 15.0 g/dL   HCT 84.6 96.2 - 95.2 %   MCV 91.1 80.0 - 100.0 fL   MCH 29.8 26.0 - 34.0 pg   MCHC 32.8 30.0 - 36.0 g/dL   RDW 84.1 32.4 - 40.1 %   Platelets 276 150 - 400 K/uL   nRBC 0.0 0.0 - 0.2 %    Comment: Performed at [facility], [address] [place], [place] [address]  Troponin I (High Sensitivity)     Status: None   Collection Time: 09/11/18  8:57 AM  Result Value Ref Range   Troponin I (High Sensitivity) 5 <18 ng/L    Comment: (NOTE) Elevated high sensitivity troponin I (hsTnI) values and significant  changes across serial measurements may suggest ACS but many other  chronic and acute conditions are known to elevate hsTnI results.  Refer to the "Links" section for chest pain algorithms and additional  guidance. Performed at [facility] [place] [facility], [address] [place], [place] [address]   Brain natriuretic peptide     Status: None   Collection Time: 09/11/18  9:16 AM  Result Value Ref Range   B Natriuretic Peptide 45.1 0.0 - 100.0 pg/mL    Comment: Performed at [facility], [address] [place], [place] [address]  SARS Coronavirus 2 (CEPHEID- Performed in [facility] hospital lab), Hosp Order     Status: Abnormal   Collection Time:  09/11/18  9:16 AM   Specimen: Nasopharyngeal Swab  Result Value Ref Range   SARS Coronavirus 2 POSITIVE (A) NEGATIVE    Comment: RESULT CALLED TO, READ BACK BY AND VERIFIED WITH: DR. Criss AlvineGOLDSTON, AT 1118 09/11/18 BY D. VANHOOK (NOTE) If result is NEGATIVE SARS-CoV-2 target nucleic acids are NOT DETECTED. The SARS-CoV-2 RNA is generally detectable in upper and lower  respiratory specimens during the acute phase of infection. The lowest  concentration of SARS-CoV-2 viral copies this assay can detect is 250  copies / mL. A negative result does not preclude SARS-CoV-2 infection  and should not be used as the sole basis for treatment or other  patient management decisions.  A negative result may occur with  improper specimen collection / handling, submission of specimen other  than nasopharyngeal swab, presence of viral mutation(s) within the  areas targeted by this assay, and inadequate number of viral copies  (<250 copies / mL). A negative result must be combined with clinical  observations, patient history, and epidemiological information. If result is POSITIVE SARS-CoV-2 target nucleic acids are DETE CTED. The SARS-CoV-2 RNA is generally detectable in upper and lower  respiratory specimens during the acute phase of infection.  Positive  results are indicative of active infection with SARS-CoV-2.  Clinical  correlation with patient history and other diagnostic information is  necessary to determine patient infection status.  Positive results do  not rule out bacterial infection or co-infection with other viruses. If result is PRESUMPTIVE POSTIVE SARS-CoV-2 nucleic acids MAY BE PRESENT.   A presumptive positive result was obtained on the submitted specimen  and confirmed on repeat testing.  While 2019 novel coronavirus  (SARS-CoV-2) nucleic acids may be present in the submitted sample  additional confirmatory testing may be necessary for epidemiological  and / or clinical management  purposes  to differentiate between  SARS-CoV-2 and other Sarbecovirus currently known to infect humans.  If clinically indicated additional testing with an alternate test  methodology ([phone] [id number]) is advised. The SARS-CoV-2 RNA is generally  detectable in upper and lower respiratory specimens during the acute  phase of infection. The expected result is Negative. Fact Sheet for Patients:  BoilerBrush.com.cyhttps://www.fda.gov/media/136312/download Fact Sheet for Healthcare Providers: https://pope.com/https://www.fda.gov/media/136313/download This test is not yet approved or cleared by the [place] FDA and has been authorized for detection and/or diagnosis of SARS-CoV-2 by FDA under an Emergency Use Authorization (EUA).  This EUA will remain in effect (meaning this test can  be used) for the duration of the COVID-19 declaration under Section 564(b)(1) of the Act, 21 U.S.C. section 360bbb-3(b)(1), unless the authorization is terminated or revoked sooner. Performed at [facility], [address] [place], [place] [address]   D-dimer, quantitative     Status: Abnormal   Collection Time: 09/11/18  9:16 AM  Result Value Ref Range   D-Dimer, Quant 0.89 (H) 0.00 - 0.50 ug/mL-FEU    Comment: (NOTE) At the manufacturer cut-off of 0.50 ug/mL FEU, this assay has been documented to exclude PE with a sensitivity and negative predictive value of 97 to 99%.  At this time, this assay has not been approved by the FDA to exclude DVT/VTE. Results should be correlated with clinical presentation. Performed at [facility], [address] [place], [place] [address]   Procalcitonin     Status: None   Collection Time: 09/11/18  9:16 AM  Result Value Ref Range   Procalcitonin 0.23 ng/mL    Comment:        Interpretation: PCT (Procalcitonin) <= 0.5 ng/mL: Systemic infection (sepsis) is not likely. Local bacterial infection is possible. (NOTE)       Sepsis PCT Algorithm           Lower Respiratory Tract                                       Infection PCT Algorithm    ----------------------------     ----------------------------         PCT < 0.25 ng/mL                PCT < 0.10 ng/mL         Strongly encourage             Strongly discourage   discontinuation of antibiotics    initiation of antibiotics    ----------------------------     -----------------------------       PCT 0.25 - 0.50 ng/mL            PCT 0.10 - 0.25 ng/mL               OR       >80% decrease in PCT            Discourage initiation of                                            antibiotics      Encourage discontinuation           of antibiotics    ----------------------------     -----------------------------         PCT >= 0.50 ng/mL              PCT 0.26 - 0.50 ng/mL               AND        <80% decrease in PCT             Encourage initiation of                                             antibiotics  Encourage continuation           of antibiotics    ----------------------------     -----------------------------        PCT >= 0.50 ng/mL                  PCT > 0.50 ng/mL               AND         increase in PCT                  Strongly encourage                                      initiation of antibiotics    Strongly encourage escalation           of antibiotics                                     -----------------------------                                           PCT <= 0.25 ng/mL                                                 OR                                        > 80% decrease in PCT                                     Discontinue / Do not initiate                                             antibiotics Performed at [facility], [address] [place], [place] [address]   Ferritin     Status: Abnormal   Collection Time: 09/11/18  9:16 AM  Result Value Ref Range   Ferritin 390 (H) 11 - 307 ng/mL    Comment: Performed at [facility], [address] [place], [place] [address]   Fibrinogen     Status: Abnormal   Collection Time: 09/11/18  9:16 AM  Result Value Ref Range   Fibrinogen >800 (H) 210 - 475 mg/dL    Comment: REPEATED TO VERIFY Performed at [facility], [address], [place], [place] [address]   C-reactive protein     Status: Abnormal   Collection Time: 09/11/18  9:16 AM  Result Value Ref Range   CRP 17.1 (H) <1.0 mg/dL    Comment: Performed at [facility], [address] [place], [place] [address]  Lactic acid, plasma     Status: None   Collection Time: 09/11/18  9:17 AM  Result Value  Ref Range   Lactic Acid, Venous 1.8 0.5 - 1.9 mmol/L    Comment: Performed at [place] [address] [place], [place] [address]  I-Stat beta hCG blood, ED     Status: None   Collection Time: 09/11/18  9:18 AM  Result Value Ref Range   I-stat hCG, quantitative <5.0 <5 mIU/mL   Comment 3            Comment:   GEST. AGE      CONC.  (mIU/mL)   <=1 WEEK        5 - 50     2 WEEKS       50 - 500     3 WEEKS       100 - 10,000     4 WEEKS     1,000 - 30,000        FEMALE AND NON-PREGNANT FEMALE:     LESS THAN 5 mIU/mL   Lactic acid, plasma     Status: None   Collection Time: 09/11/18 10:44 AM  Result Value Ref Range   Lactic Acid, Venous 1.2 0.5 - 1.9 mmol/L    Comment: Performed at [place] [address] [place], [place] [address]  Troponin I (High Sensitivity)     Status: None   Collection Time: 09/11/18 10:48 AM  Result Value Ref Range   Troponin I (High Sensitivity) 5 <18 ng/L    Comment: (NOTE) Elevated high sensitivity troponin I (hsTnI) values and significant  changes across serial measurements may suggest ACS but many other  chronic and acute conditions are known to elevate hsTnI results.  Refer to the "Links" section for chest pain algorithms and additional  guidance. Performed at [facility], [place] [address] [place], [place] [address]   Triglycerides     Status: None   Collection Time: 09/11/18  11:21 AM  Result Value Ref Range   Triglycerides 91 <150 mg/dL    Comment: Performed at [place] [address], [place], [place] [address]  Triglycerides     Status: None   Collection Time: 09/11/18 12:05 PM  Result Value Ref Range   Triglycerides 89 <150 mg/dL    Comment: Performed at [place] [address], [place], [place] [address]  Lactate dehydrogenase     Status: Abnormal   Collection Time: 09/11/18 12:15 PM  Result Value Ref Range   LDH 328 (H) 98 - 192 U/L    Comment: Performed at [place] [address], [place], [place] [address]  ABO/Rh     Status: None   Collection Time: 09/11/18 12:15 PM  Result Value Ref Range   ABO/RH(D)      B POS Performed at [place] [address], [place], [place] [address]   Type and screen [place]     Status: None   Collection Time: 09/11/18 12:15 PM  Result Value Ref Range   ABO/RH(D) B POS    Antibody Screen NEG    Sample Expiration      09/14/2018,2359 Performed at [facility], [place] [address] [place], [place] [address]   C-reactive protein     Status: Abnormal   Collection Time: 09/11/18 12:15 PM  Result Value Ref Range   CRP 15.6 (H) <1.0 mg/dL    Comment: Performed at [place] [address] [place], [place] [address]  D-dimer, quantitative (  not at [facility])     Status: Abnormal   Collection Time: 09/11/18 12:15 PM  Result Value Ref Range   D-Dimer, Quant 0.68 (H) 0.00 - 0.50 ug/mL-FEU    Comment: (NOTE) At the manufacturer cut-off of 0.50 ug/mL FEU, this assay has been documented to exclude PE with a sensitivity and negative predictive value of 97 to 99%.  At this time, this assay has not been approved by the FDA to exclude DVT/VTE. Results should be correlated with clinical presentation. Performed at [facility], [address] [place], [place] [address]   Fibrinogen     Status: Abnormal   Collection Time: 09/11/18 12:15 PM  Result  Value Ref Range   Fibrinogen >800 (H) 210 - 475 mg/dL    Comment: Performed at [facility], [address] [place], [place] [address]  Procalcitonin     Status: None   Collection Time: 09/11/18 12:15 PM  Result Value Ref Range   Procalcitonin 0.17 ng/mL    Comment:        Interpretation: PCT (Procalcitonin) <= 0.5 ng/mL: Systemic infection (sepsis) is not likely. Local bacterial infection is possible. (NOTE)       Sepsis PCT Algorithm           Lower Respiratory Tract                                      Infection PCT Algorithm    ----------------------------     ----------------------------         PCT < 0.25 ng/mL                PCT < 0.10 ng/mL         Strongly encourage             Strongly discourage   discontinuation of antibiotics    initiation of antibiotics    ----------------------------     -----------------------------       PCT 0.25 - 0.50 ng/mL            PCT 0.10 - 0.25 ng/mL               OR       >80% decrease in PCT            Discourage initiation of                                            antibiotics      Encourage discontinuation           of antibiotics    ----------------------------     -----------------------------         PCT >= 0.50 ng/mL              PCT 0.26 - 0.50 ng/mL               AND        <80% decrease in PCT             Encourage initiation of  antibiotics       Encourage continuation           of antibiotics    ----------------------------     -----------------------------        PCT >= 0.50 ng/mL                  PCT > 0.50 ng/mL               AND         increase in PCT                  Strongly encourage                                      initiation of antibiotics    Strongly encourage escalation           of antibiotics                                     -----------------------------                                           PCT <= 0.25 ng/mL                                                  OR                                        > 80% decrease in PCT                                     Discontinue / Do not initiate                                             antibiotics Performed at [facility], [address] [place], [place] [address]   Sedimentation rate     Status: Abnormal   Collection Time: 09/11/18 12:15 PM  Result Value Ref Range   Sed Rate 116 (H) 0 - 22 mm/hr    Comment: Performed at [facility], [address], [place], [place] [address]  CBG monitoring, ED     Status: Abnormal   Collection Time: 09/11/18  4:26 PM  Result Value Ref Range   Glucose-Capillary 184 (H) 70 - 99 mg/dL   Comment 1 Notify RN    Comment 2 Document in Chart    Dg Chest Portable 1 View  Result Date: 09/11/2018 CLINICAL DATA:  Hypoxia.  Chest tightness.  History of asthma. EXAM: PORTABLE CHEST 1 VIEW COMPARISON:  None. FINDINGS: Mild patchy opacities in the bases, left greater than right. No pneumothorax. The cardiomediastinal silhouette is unremarkable. No other acute abnormalities. IMPRESSION: Mild patchy opacities in the bases, left greater than right,  may represent developing pneumonia. Atypical infections should be considered. No other acute abnormalities are identified. Electronically Signed   By: Gerome Samavid  Williams III M.D   On: 09/11/2018 09:41    Pending Labs Unresulted Labs (From admission, onward)    Start     Ordered   09/12/18 0500  CBC with Differential/Platelet  Daily,   R     09/11/18 1141   09/12/18 0500  Comprehensive metabolic panel  Daily,   R     09/11/18 1141   09/12/18 0500  C-reactive protein  Daily,   R     09/11/18 1141   09/12/18 0500  CK  Daily,   R     09/11/18 1141   09/12/18 0500  D-dimer, quantitative (not at [facility])  Daily,   R     09/11/18 1141   09/12/18 0500  Ferritin  Daily,   R     09/11/18 1141   09/12/18 0500  Interleukin-6, Plasma  Daily,   R     09/11/18 1141   09/12/18 0500  Magnesium  Daily,   R     09/11/18  1141   09/12/18 0500  Phosphorus  Daily,   R     09/11/18 1141   09/12/18 0500  Triglycerides  Daily,   R     09/11/18 1141   09/11/18 1215  Hemoglobin A1c  Once,   STAT    Comments: To assess prior glycemic control    09/11/18 1214   09/11/18 1138  Interleukin-6, Plasma  ONCE - STAT,   STAT     09/11/18 1141   09/11/18 1138  Hepatitis B surface antigen  ONCE - STAT,   STAT     09/11/18 1141   09/11/18 1138  Legionella Pneumophila Serogp 1 Ur Ag  Once,   STAT     09/11/18 1141   09/11/18 1138  Strep pneumoniae urinary antigen  Once,   STAT     09/11/18 1141   09/11/18 1138  Culture, sputum-assessment  Once,   R     09/11/18 1141   09/11/18 1137  HIV antibody (Routine Testing)  Add-on,   AD     09/11/18 1141   09/11/18 1137  Glucose 6 phosphate dehydrogenase  ONCE - STAT,   STAT     09/11/18 1141   09/11/18 0917  Culture, blood (routine x 2)  BLOOD CULTURE X 2,   STAT     09/11/18 0916          Vitals/Pain Today's Vitals   09/11/18 1645 09/11/18 1700 09/11/18 1740 09/11/18 1800  BP: 104/63 106/63  101/62  Pulse: 86 85    Resp: (!) 34 (!) 29  (!) 29  Temp:      SpO2: 98% 97%  98%  PainSc:   0-No pain     Isolation Precautions Airborne and Contact precautions  Medications Medications  sodium chloride flush (NS) 0.9 % injection 3 mL (3 mLs Intravenous Not Given 09/11/18 1031)  montelukast (SINGULAIR) tablet 10 mg (has no administration in time range)  enoxaparin (LOVENOX) injection 40 mg (40 mg Subcutaneous Not Given 09/11/18 1534)  sodium chloride flush (NS) 0.9 % injection 3 mL (3 mLs Intravenous Not Given 09/11/18 1400)  albuterol (VENTOLIN HFA) 108 (90 Base) MCG/ACT inhaler 2 puff (2 puffs Inhalation Given 09/11/18 1532)  guaiFENesin-dextromethorphan (ROBITUSSIN DM) 100-10 MG/5ML syrup 10 mL (10 mLs Oral Given 09/11/18 1533)  chlorpheniramine-HYDROcodone (TUSSIONEX) 10-8 MG/5ML suspension 5 mL (has no administration  in time range)  vitamin C (ASCORBIC ACID) tablet 500  mg (has no administration in time range)  zinc sulfate capsule 220 mg (220 mg Oral Given 09/11/18 1533)  famotidine (PEPCID) tablet 20 mg (20 mg Oral Given 09/11/18 1532)  ondansetron (ZOFRAN) tablet 4 mg (has no administration in time range)    Or  ondansetron (ZOFRAN) injection 4 mg (has no administration in time range)  insulin aspart (novoLOG) injection 0-9 Units (2 Units Subcutaneous Given 09/11/18 1639)  remdesivir 100 mg in sodium chloride 0.9 % 250 mL IVPB (has no administration in time range)  albuterol (VENTOLIN HFA) 108 (90 Base) MCG/ACT inhaler 2 puff (2 puffs Inhalation Given 09/11/18 0858)  albuterol (VENTOLIN HFA) 108 (90 Base) MCG/ACT inhaler (  Given 09/11/18 0911)  albuterol (VENTOLIN HFA) 108 (90 Base) MCG/ACT inhaler 8 puff (8 puffs Inhalation Given 09/11/18 0914)  ipratropium (ATROVENT HFA) inhaler 2 puff (2 puffs Inhalation Given 09/11/18 1030)  cefTRIAXone (ROCEPHIN) 1 g in sodium chloride 0.9 % 100 mL IVPB (0 g Intravenous Stopped 09/11/18 1042)  azithromycin (ZITHROMAX) 500 mg in sodium chloride 0.9 % 250 mL IVPB (0 mg Intravenous Stopped 09/11/18 1158)  0.9 %  sodium chloride infusion ( Intravenous New Bag/Given 09/11/18 1223)  dexamethasone (DECADRON) injection 6 mg (6 mg Intravenous Given 09/11/18 1533)  remdesivir 200 mg in sodium chloride 0.9 % 250 mL IVPB (0 mg Intravenous Stopped 09/11/18 1510)    Mobility walks Low fall risk   Focused Assessments Pulmonary Assessment Handoff:  Lung sounds: L Breath Sounds: Clear, Diminished R Breath Sounds: Clear, Diminished O2 Device: Nasal Cannula O2 Flow Rate (L/min): 2 L/min      R Recommendations: See Admitting Provider Note  Report given to:   Additional Notes:   Acute hypoxic respiratory failure secondary to pneumonia due to COVID-19: Acute.  Patient presents with cough and shortness of breath.  Found to be COVID-19 positive with signs of bilateral infiltrates on chest x-ray.  Patient was initially given Rocephin  and azithromycin in the ED. Admit to telemetry bed [facility] Continuous pulse oximetry with nasal cannula oxygen Follow-up pending inflammatory markers Determine need to continue antibiotics Albuterol inhaler every 6 hours Decadron 6 mg IV x1 dose now Antitussives as needed Vitamin C and zinc Daily monitoring of inflammatory markers Remdesivir per pharmacy

## 2018-09-12 DIAGNOSIS — U071 COVID-19: Principal | ICD-10-CM

## 2018-09-12 DIAGNOSIS — J9601 Acute respiratory failure with hypoxia: Secondary | ICD-10-CM

## 2018-09-12 DIAGNOSIS — R739 Hyperglycemia, unspecified: Secondary | ICD-10-CM

## 2018-09-12 DIAGNOSIS — R6510 Systemic inflammatory response syndrome (SIRS) of non-infectious origin without acute organ dysfunction: Secondary | ICD-10-CM

## 2018-09-12 DIAGNOSIS — J1289 Other viral pneumonia: Secondary | ICD-10-CM

## 2018-09-12 LAB — CBC WITH DIFFERENTIAL/PLATELET
Abs Immature Granulocytes: 0.06 10*3/uL (ref 0.00–0.07)
Basophils Absolute: 0 10*3/uL (ref 0.0–0.1)
Basophils Relative: 0 %
Eosinophils Absolute: 0 10*3/uL (ref 0.0–0.5)
Eosinophils Relative: 0 %
HCT: 35.5 % — ABNORMAL LOW (ref 36.0–46.0)
Hemoglobin: 11.4 g/dL — ABNORMAL LOW (ref 12.0–15.0)
Immature Granulocytes: 1 %
Lymphocytes Relative: 14 %
Lymphs Abs: 1.3 10*3/uL (ref 0.7–4.0)
MCH: 29.4 pg (ref 26.0–34.0)
MCHC: 32.1 g/dL (ref 30.0–36.0)
MCV: 91.5 fL (ref 80.0–100.0)
Monocytes Absolute: 0.1 10*3/uL (ref 0.1–1.0)
Monocytes Relative: 1 %
Neutro Abs: 7.5 10*3/uL (ref 1.7–7.7)
Neutrophils Relative %: 84 %
Platelets: 319 10*3/uL (ref 150–400)
RBC: 3.88 MIL/uL (ref 3.87–5.11)
RDW: 13.7 % (ref 11.5–15.5)
WBC: 8.9 10*3/uL (ref 4.0–10.5)
nRBC: 0 % (ref 0.0–0.2)

## 2018-09-12 LAB — COMPREHENSIVE METABOLIC PANEL
ALT: 70 U/L — ABNORMAL HIGH (ref 0–44)
AST: 51 U/L — ABNORMAL HIGH (ref 15–41)
Albumin: 3.3 g/dL — ABNORMAL LOW (ref 3.5–5.0)
Alkaline Phosphatase: 43 U/L (ref 38–126)
Anion gap: 10 (ref 5–15)
BUN: 13 mg/dL (ref 6–20)
CO2: 25 mmol/L (ref 22–32)
Calcium: 8.4 mg/dL — ABNORMAL LOW (ref 8.9–10.3)
Chloride: 104 mmol/L (ref 98–111)
Creatinine, Ser: 0.48 mg/dL (ref 0.44–1.00)
GFR calc Af Amer: >60 mL/min (ref >60)
GFR calc non Af Amer: >60 mL/min (ref >60)
Glucose, Bld: 190 mg/dL — ABNORMAL HIGH (ref 70–99)
Potassium: 4.2 mmol/L (ref 3.5–5.1)
Sodium: 139 mmol/L (ref 135–145)
Total Bilirubin: 0.7 mg/dL (ref 0.3–1.2)
Total Protein: 7.4 g/dL (ref 6.5–8.1)

## 2018-09-12 LAB — HEMOGLOBIN A1C
Hgb A1c MFr Bld: 7.1 % — ABNORMAL HIGH (ref 4.8–5.6)
Hgb A1c MFr Bld: 7.2 % — ABNORMAL HIGH (ref 4.8–5.6)
Mean Plasma Glucose: 157.07 mg/dL
Mean Plasma Glucose: 159.94 mg/dL

## 2018-09-12 LAB — MAGNESIUM: Magnesium: 2.5 mg/dL — ABNORMAL HIGH (ref 1.7–2.4)

## 2018-09-12 LAB — TYPE AND SCREEN
ABO/RH(D): B POS
Antibody Screen: NEGATIVE

## 2018-09-12 LAB — PREGNANCY, URINE: Preg Test, Ur: NEGATIVE

## 2018-09-12 LAB — GLUCOSE, CAPILLARY
Glucose-Capillary: 196 mg/dL — ABNORMAL HIGH (ref 70–99)
Glucose-Capillary: 189 mg/dL — ABNORMAL HIGH (ref 70–99)
Glucose-Capillary: 227 mg/dL — ABNORMAL HIGH (ref 70–99)
Glucose-Capillary: 218 mg/dL — ABNORMAL HIGH (ref 70–99)
Glucose-Capillary: 194 mg/dL — ABNORMAL HIGH (ref 70–99)

## 2018-09-12 LAB — FERRITIN: Ferritin: 374 ng/mL — ABNORMAL HIGH (ref 11–307)

## 2018-09-12 LAB — HIV ANTIBODY (ROUTINE TESTING W REFLEX): HIV Screen 4th Generation wRfx: NONREACTIVE

## 2018-09-12 LAB — D-DIMER, QUANTITATIVE: D-Dimer, Quant: 1.21 ug/mL-FEU — ABNORMAL HIGH (ref 0.00–0.50)

## 2018-09-12 LAB — TROPONIN I (HIGH SENSITIVITY): Troponin I (High Sensitivity): 3 ng/L (ref <18)

## 2018-09-12 LAB — LACTATE DEHYDROGENASE: LDH: 256 U/L — ABNORMAL HIGH (ref 98–192)

## 2018-09-12 LAB — ABO/RH: ABO/RH(D): B POS

## 2018-09-12 LAB — C-REACTIVE PROTEIN: CRP: 9.7 mg/dL — ABNORMAL HIGH (ref <1.0)

## 2018-09-12 MED ORDER — ALBUTEROL SULFATE HFA 108 (90 BASE) MCG/ACT IN AERS
2.0000 | INHALATION_SPRAY | RESPIRATORY_TRACT | Status: DC
  Administered 2018-09-12 – 2018-09-15 (×18): 2 via RESPIRATORY_TRACT

## 2018-09-12 MED ORDER — INSULIN DETEMIR 100 UNIT/ML ~~LOC~~ SOLN: 10.0000 [IU] | Freq: Every day | SUBCUTANEOUS | Status: DC

## 2018-09-12 MED ORDER — INSULIN ASPART 100 UNIT/ML ~~LOC~~ SOLN
6.0000 [IU] | Freq: Three times a day (TID) | SUBCUTANEOUS | Status: DC
  Administered 2018-09-12 – 2018-09-14 (×6): 6 [IU] via SUBCUTANEOUS

## 2018-09-12 MED ORDER — INSULIN ASPART 100 UNIT/ML ~~LOC~~ SOLN
0.0000 [IU] | Freq: Every day | SUBCUTANEOUS | Status: DC
  Administered 2018-09-13: 3 [IU] via SUBCUTANEOUS

## 2018-09-12 MED ORDER — INSULIN ASPART 100 UNIT/ML ~~LOC~~ SOLN
0.0000 [IU] | Freq: Three times a day (TID) | SUBCUTANEOUS | Status: DC
  Administered 2018-09-12: 12:00:00 7 [IU] via SUBCUTANEOUS
  Administered 2018-09-12: 4 [IU] via SUBCUTANEOUS
  Administered 2018-09-12: 7 [IU] via SUBCUTANEOUS
  Administered 2018-09-13: 11 [IU] via SUBCUTANEOUS
  Administered 2018-09-13 (×2): 4 [IU] via SUBCUTANEOUS
  Administered 2018-09-14 (×2): 7 [IU] via SUBCUTANEOUS
  Administered 2018-09-15: 4 [IU] via SUBCUTANEOUS

## 2018-09-12 MED ORDER — DEXAMETHASONE 6 MG PO TABS
6.0000 mg | ORAL_TABLET | Freq: Every day | ORAL | Status: DC
  Administered 2018-09-12 – 2018-09-15 (×4): 6 mg via ORAL
  Filled 2018-09-12 (×4): qty 1

## 2018-09-12 MED ORDER — INSULIN DETEMIR 100 UNIT/ML ~~LOC~~ SOLN
10.0000 [IU] | Freq: Two times a day (BID) | SUBCUTANEOUS | Status: DC
  Administered 2018-09-12: 10 [IU] via SUBCUTANEOUS
  Filled 2018-09-12 (×2): qty 0.1

## 2018-09-12 MED ORDER — ALBUTEROL SULFATE HFA 108 (90 BASE) MCG/ACT IN AERS: 2.0000 | INHALATION_SPRAY | RESPIRATORY_TRACT | Status: DC | PRN

## 2018-09-12 MED ORDER — INSULIN DETEMIR 100 UNIT/ML ~~LOC~~ SOLN
20.0000 [IU] | Freq: Two times a day (BID) | SUBCUTANEOUS | Status: DC
  Administered 2018-09-12 – 2018-09-14 (×4): 20 [IU] via SUBCUTANEOUS
  Filled 2018-09-12 (×5): qty 0.2

## 2018-09-12 NOTE — Progress Notes (Signed)
[facility] PROGRESS NOTE    Progress Note  Ruth Fox  PNT:[id number] DOB: [birth year] DOA: 09/11/2018 PCP: Patient, No Pcp Per     Brief Narrative:   Ruth Fox is an 43 y.o. female past medical history of diabetes mellitus uncontrolled, asthma comes in with 1 week of progressive weakness and cough along with progressive shortness of breath.  He denies any fever nausea vomiting and diarrhea.  In admission to the ED she was found to be breathing 38 times per minute satting 86% on room air improved to 92% on 2 L.  SARS-CoV-2 test was positive in the ED  Assessment/Plan:   Acute respiratory failure with hypoxia due to Pneumonia due to COVID-19 virus: Currently on 5 L of oxygen to keep saturation above 93%.  Chest x-ray was reviewed that show patchy infiltrates more predominant on the left than on the right. Inflammatory markers are significantly elevated especially her CRP. She is started empirically on IV Rocephin and azithromycin, her white blood cell count is just barely elevated, his procalcitonin was 0.17.  Will discontinue IV empiric antibiotics Start empirically on vitamin C and zinc. CRP is greater than 15, will start her on IV Remdesivir her IV Decadron and Actemra The treatment plan and use of medications and known side effects were discussed with patient/family, they were clearly explained that there is no proven definitive treatment for COVID-19 infection, any medications used here are based on published clinical articles/anecdotal data which are not peer-reviewed or randomized control trials.  Complete risks and long-term side effects are unknown, however in the best clinical judgment they seem to be of some clinical benefit rather than medical risks.  Patient/family agree with the treatment plan and want to receive the given medications.  SIRS: Likely due to SARS-CoV-2. Unlikely to be bacterial, culture data is pending.  History of asthma: No  wheezing on physical exam continue current home meds.  Hyperglycemia: A1c is pending, she does not know why she was started on metformin her BMI is 36 she likely has uncontrolled diabetes mellitus type 2. Now currently on steroids and infection with SARS-CoV-2, will start her on long-acting insulin and resistant sliding scale.   DVT prophylaxis: lovenxo Family Communication:none Disposition Plan/Barrier to D/C: once off oxygen Code Status:     Code Status Orders  (From admission, onward)         Start     Ordered   09/11/18 1135  Full code  Continuous     09/11/18 1141        Code Status History    This patient has a current code status but no historical code status.   Advance Care Planning Activity        IV Access:    Peripheral IV   Procedures and diagnostic studies:   Dg Chest Portable 1 View  Result Date: 09/11/2018 CLINICAL DATA:  Hypoxia.  Chest tightness.  History of asthma. EXAM: PORTABLE CHEST 1 VIEW COMPARISON:  None. FINDINGS: Mild patchy opacities in the bases, left greater than right. No pneumothorax. The cardiomediastinal silhouette is unremarkable. No other acute abnormalities. IMPRESSION: Mild patchy opacities in the bases, left greater than right, may represent developing pneumonia. Atypical infections should be considered. No other acute abnormalities are identified. Electronically Signed   By: Dorise Bullion III M.D   On: 09/11/2018 09:41     Medical Consultants:    None.  Anti-Infectives:   none  Subjective:    Ruth Fox relates she still  feels weak tired and short of breath.  Objective:    Vitals:   09/12/18 0024 09/12/18 0346 09/12/18 0434 09/12/18 0624  BP:  119/68    Pulse: 87 89  84  Resp: (!) 24 (!) 25  (!) 25  Temp:  98.9 F (37.2 C)    TempSrc:  Oral    SpO2: 95% 95%  93%  Weight:   91.1 kg   Height:       SpO2: 93 % O2 Flow Rate (L/min): 5 L/min   Intake/Output Summary (Last 24 hours) at  09/12/2018 0649 Last data filed at 09/12/2018 0300 Gross per 24 hour  Intake 475 ml  Output 400 ml  Net 75 ml   Filed Weights   09/11/18 2259 09/12/18 0434  Weight: 91.3 kg 91.1 kg    Exam: General exam: In no acute distress. Respiratory system: Good air movement and diffuse crackles at bases bilaterally. Cardiovascular system: S1 & S2 heard, RRR. No JVD. Gastrointestinal system: Abdomen is nondistended, soft and nontender.  Central nervous system: Alert and oriented. No focal neurological deficits. Extremities: No pedal edema. Skin: No rashes, lesions or ulcers Psychiatry: Judgement and insight appear normal. Mood & affect appropriate.    Data Reviewed:    Labs: Basic Metabolic Panel: Recent Labs  Lab 09/11/18 0857  NA 136  K 4.3  CL 97*  CO2 27  GLUCOSE 176*  BUN 7  CREATININE 0.64  CALCIUM 8.6*   GFR Estimated Creatinine Clearance: 94.2 mL/min (by C-G formula based on SCr of 0.64 mg/dL). Liver Function Tests: No results for input(s): AST, ALT, ALKPHOS, BILITOT, PROT, ALBUMIN in the last 168 hours. No results for input(s): LIPASE, AMYLASE in the last 168 hours. No results for input(s): AMMONIA in the last 168 hours. Coagulation profile No results for input(s): INR, PROTIME in the last 168 hours. COVID-19 Labs  Recent Labs    09/11/18 0916 09/11/18 1215  DDIMER 0.89* 0.68*  FERRITIN 390*  --   LDH  --  328*  CRP 17.1* 15.6*    Lab Results  Component Value Date   SARSCOV2NAA POSITIVE (A) 09/11/2018    CBC: Recent Labs  Lab 09/11/18 0857  WBC 10.9*  HGB 13.1  HCT 40.0  MCV 91.1  PLT 276   Cardiac Enzymes: No results for input(s): CKTOTAL, CKMB, CKMBINDEX, TROPONINI in the last 168 hours. BNP (last 3 results) No results for input(s): PROBNP in the last 8760 hours. CBG: Recent Labs  Lab 09/11/18 1626 09/11/18 2312 09/12/18 0344  GLUCAP 184* 210* 196*   D-Dimer: Recent Labs    09/11/18 0916 09/11/18 1215  DDIMER 0.89* 0.68*   Hgb  A1c: No results for input(s): HGBA1C in the last 72 hours. Lipid Profile: Recent Labs    09/11/18 1121 09/11/18 1205  TRIG 91 89   Thyroid function studies: No results for input(s): TSH, T4TOTAL, T3FREE, THYROIDAB in the last 72 hours.  Invalid input(s): FREET3 Anemia work up: Recent Labs    09/11/18 0916  FERRITIN 390*   Sepsis Labs: Recent Labs  Lab 09/11/18 0857 09/11/18 0916 09/11/18 0917 09/11/18 1044 09/11/18 1215  PROCALCITON  --  0.23  --   --  0.17  WBC 10.9*  --   --   --   --   LATICACIDVEN  --   --  1.8 1.2  --    Microbiology Recent Results (from the past 240 hour(s))  SARS Coronavirus 2 (CEPHEID- Performed [facility]  Status: Abnormal   Collection Time: 09/11/18  9:16 AM   Specimen: Nasopharyngeal Swab  Result Value Ref Range Status   SARS Coronavirus 2 POSITIVE (A) NEGATIVE Final    Comment: RESULT CALLED TO, READ BACK BY AND VERIFIED WITH: DR. Criss AlvineGOLDSTON, AT 1118 09/11/18 BY D. VANHOOK (NOTE) If result is NEGATIVE SARS-CoV-2 target nucleic acids are NOT DETECTED. The SARS-CoV-2 RNA is generally detectable in upper and lower  respiratory specimens during the acute phase of infection. The lowest  concentration of SARS-CoV-2 viral copies this assay can detect is 250  copies / mL. A negative result does not preclude SARS-CoV-2 infection  and should not be used as the sole basis for treatment or other  patient management decisions.  A negative result may occur with  improper specimen collection / handling, submission of specimen other  than nasopharyngeal swab, presence of viral mutation(s) within the  areas targeted by this assay, and inadequate number of viral copies  (<250 copies / mL). A negative result must be combined with clinical  observations, patient history, and epidemiological information. If result is POSITIVE SARS-CoV-2 target nucleic acids are DETE CTED. The SARS-CoV-2 RNA is generally detectable in upper  and lower  respiratory specimens during the acute phase of infection.  Positive  results are indicative of active infection with SARS-CoV-2.  Clinical  correlation with patient history and other diagnostic information is  necessary to determine patient infection status.  Positive results do  not rule out bacterial infection or co-infection with other viruses. If result is PRESUMPTIVE POSTIVE SARS-CoV-2 nucleic acids MAY BE PRESENT.   A presumptive positive result was obtained on the submitted specimen  and confirmed on repeat testing.  While 2019 novel coronavirus  (SARS-CoV-2) nucleic acids may be present in the submitted sample  additional confirmatory testing may be necessary for epidemiological  and / or clinical management purposes  to differentiate between  SARS-CoV-2 and other Sarbecovirus currently known to infect humans.  If clinically indicated additional testing with an alternate test  methodology (LAB [phone] [id number]) is advised. The SARS-CoV-2 RNA is generally  detectable in upper and lower respiratory specimens during the acute  phase of infection. The expected result is Negative. Fact Sheet for Patients:  BoilerBrush.com.cyhttps://www.fda.gov/media/136312/download Fact Sheet for Healthcare Providers: https://pope.com/https://www.fda.gov/media/136313/download This test is not yet approved or cleared by the [place] FDA and has been authorized for detection and/or diagnosis of SARS-CoV-2 by FDA under an Emergency Use Authorization (EUA).  This EUA will remain in effect (meaning this test can be used) for the duration of the COVID-19 declaration under Section 564(b)(1) of the Act, 21 U.S.C. section 360bbb-3(b)(1), unless the authorization is terminated or revoked sooner. Performed at [facility], [address] [place], [place] [address]      Medications:   . enoxaparin (LOVENOX) injection  40 mg Subcutaneous Q24H  . famotidine  20 mg Oral BID  . insulin aspart  0-15 Units Subcutaneous Q4H  .  Ipratropium-Albuterol  1 puff Inhalation Q6H  . methylPREDNISolone (SOLU-MEDROL) injection  60 mg Intravenous Q8H  . montelukast  10 mg Oral QHS  . sodium chloride flush  3 mL Intravenous Once  . sodium chloride flush  3 mL Intravenous Q12H  . vitamin C  500 mg Oral Daily  . zinc sulfate  220 mg Oral Daily   Continuous Infusions: . remdesivir 100 mg in NS 250 mL       LOS: 1 day   Marinda ElkAbraham Feliz Ortiz  [facility]  09/12/2018, 6:49 AM       \

## 2018-09-13 LAB — COMPREHENSIVE METABOLIC PANEL
ALT: 58 U/L — ABNORMAL HIGH (ref 0–44)
AST: 37 U/L (ref 15–41)
Albumin: 2.9 g/dL — ABNORMAL LOW (ref 3.5–5.0)
Alkaline Phosphatase: 42 U/L (ref 38–126)
Anion gap: 9 (ref 5–15)
BUN: 14 mg/dL (ref 6–20)
CO2: 25 mmol/L (ref 22–32)
Calcium: 8.2 mg/dL — ABNORMAL LOW (ref 8.9–10.3)
Chloride: 103 mmol/L (ref 98–111)
Creatinine, Ser: 0.51 mg/dL (ref 0.44–1.00)
GFR calc Af Amer: >60 mL/min (ref >60)
GFR calc non Af Amer: >60 mL/min (ref >60)
Glucose, Bld: 173 mg/dL — ABNORMAL HIGH (ref 70–99)
Potassium: 4 mmol/L (ref 3.5–5.1)
Sodium: 137 mmol/L (ref 135–145)
Total Bilirubin: 0.4 mg/dL (ref 0.3–1.2)
Total Protein: 6.6 g/dL (ref 6.5–8.1)

## 2018-09-13 LAB — D-DIMER, QUANTITATIVE: D-Dimer, Quant: 1.25 ug/mL-FEU — ABNORMAL HIGH (ref 0.00–0.50)

## 2018-09-13 LAB — GLUCOSE, CAPILLARY
Glucose-Capillary: 160 mg/dL — ABNORMAL HIGH (ref 70–99)
Glucose-Capillary: 193 mg/dL — ABNORMAL HIGH (ref 70–99)
Glucose-Capillary: 267 mg/dL — ABNORMAL HIGH (ref 70–99)
Glucose-Capillary: 258 mg/dL — ABNORMAL HIGH (ref 70–99)

## 2018-09-13 LAB — LACTATE DEHYDROGENASE: LDH: 260 U/L — ABNORMAL HIGH (ref 98–192)

## 2018-09-13 LAB — CBC WITH DIFFERENTIAL/PLATELET
Abs Immature Granulocytes: 0.19 10*3/uL — ABNORMAL HIGH (ref 0.00–0.07)
Basophils Absolute: 0 10*3/uL (ref 0.0–0.1)
Basophils Relative: 0 %
Eosinophils Absolute: 0 10*3/uL (ref 0.0–0.5)
Eosinophils Relative: 0 %
HCT: 35 % — ABNORMAL LOW (ref 36.0–46.0)
Hemoglobin: 11.2 g/dL — ABNORMAL LOW (ref 12.0–15.0)
Immature Granulocytes: 1 %
Lymphocytes Relative: 13 %
Lymphs Abs: 1.9 10*3/uL (ref 0.7–4.0)
MCH: 29.2 pg (ref 26.0–34.0)
MCHC: 32 g/dL (ref 30.0–36.0)
MCV: 91.1 fL (ref 80.0–100.0)
Monocytes Absolute: 0.7 10*3/uL (ref 0.1–1.0)
Monocytes Relative: 5 %
Neutro Abs: 12.1 10*3/uL — ABNORMAL HIGH (ref 1.7–7.7)
Neutrophils Relative %: 81 %
Platelets: 368 10*3/uL (ref 150–400)
RBC: 3.84 MIL/uL — ABNORMAL LOW (ref 3.87–5.11)
RDW: 13.7 % (ref 11.5–15.5)
WBC: 14.8 10*3/uL — ABNORMAL HIGH (ref 4.0–10.5)
nRBC: 0 % (ref 0.0–0.2)

## 2018-09-13 LAB — C-REACTIVE PROTEIN: CRP: 4.6 mg/dL — ABNORMAL HIGH (ref <1.0)

## 2018-09-13 LAB — GLUCOSE 6 PHOSPHATE DEHYDROGENASE
G6PDH: 9.1 U/g{Hb} (ref 4.6–13.5)
Hemoglobin: 11.7 g/dL (ref 11.1–15.9)

## 2018-09-13 LAB — INTERLEUKIN-6, PLASMA: Interleukin-6, Plasma: 14.3 pg/mL — ABNORMAL HIGH (ref 0.0–12.2)

## 2018-09-13 LAB — FERRITIN: Ferritin: 307 ng/mL (ref 11–307)

## 2018-09-13 LAB — MAGNESIUM: Magnesium: 2.3 mg/dL (ref 1.7–2.4)

## 2018-09-13 NOTE — Plan of Care (Signed)
  Problem: Education: Goal: Knowledge of General Education information will improve Description: Including pain rating scale, medication(s)/side effects and non-pharmacologic comfort measures Outcome: Progressing   Problem: Health Behavior/Discharge Planning: Goal: Ability to manage health-related needs will improve Outcome: Progressing   Problem: Clinical Measurements: Goal: Ability to maintain clinical measurements within normal limits will improve Outcome: Progressing Goal: Will remain free from infection Outcome: Progressing Goal: Respiratory complications will improve Outcome: Progressing   Problem: Pain Managment: Goal: General experience of comfort will improve Outcome: Progressing   Problem: Safety: Goal: Ability to remain free from injury will improve Outcome: Progressing   

## 2018-09-13 NOTE — Progress Notes (Signed)
PROGRESS NOTE                                                                                                                                                                                                             Ruth Fox Demographics:    Ruth Fox, is a 43 y.o. female, DOB - 02/26/1975, ZOX:[id number]  Outpatient Primary MD for the Ruth Fox is Ruth Fox, No Pcp Per    LOS - 2  Chief Complaint  Ruth Fox presents with  . Chest Pain  . Shortness of Breath       Brief Narrative: Ruth Fox is a 43 y.o. female with PMHx of DM-2, bronchial asthma-presenting to the ED with acute hypoxemic respiratory failure secondary to COVID-19 pneumonia.   Subjective:    Ruth Fox today remains comfortable-she denies any shortness of breath at rest.  She denies any fever-continues to have some cough.   Assessment  & Plan :   Acute Hypoxic Resp Failure due to Covid 19 Viral pneumonia: Remains on 5 L-but appears comfortable.  Inflammatory markers downtrending.  Continue steroids and Remdesivir.  Ruth Fox is s/p Actemra on 7/21.  If hypoxia worsens-probably will need either a second dose of Actemra and or convalescent plasma.  COVID-19 Labs:  Recent Labs    09/11/18 0916 09/11/18 1215 09/12/18 0720 09/13/18 0210  DDIMER 0.89* 0.68* 1.21* 1.25*  FERRITIN 390*  --  374* 307  LDH  --  328* 256* 260*  CRP 17.1* 15.6* 9.7* 4.6*    Lab Results  Component Value Date   SARSCOV2NAA POSITIVE (A) 09/11/2018     COVID-19 Medications: 7/20>> Remdesivir 7/20>> Decadron 7/20>> Actemra  DM-2: CBG stable-continue Levemir 20 units twice daily, 6 units of NovoLog with meals and SSI-Metformin remains on hold  Bronchial asthma: Stable-continue with as needed bronchodilators  ABG: No results found for: PHART, PCO2ART, PO2ART, HCO3, TCO2, ACIDBASEDEF, O2SAT  Condition - Stable  Family Communication  :  Daughter updated over the phone   Code Status :  Full Code  Diet :  Diet Order            Diet regular Room service appropriate? Yes; Fluid consistency: Thin  Diet effective now               Disposition Plan  :  Remain inpatient  Consults  :  PCCM  Procedures  :  None  DVT Prophylaxis  :  Lovenox   Lab Results  Component Value Date   PLT 368 09/13/2018    Inpatient Medications  Scheduled Meds: . albuterol  2 puff Inhalation Q4H  . dexamethasone  6 mg Oral Daily  . enoxaparin (LOVENOX) injection  40 mg Subcutaneous Q24H  . famotidine  20 mg Oral BID  . insulin aspart  0-20 Units Subcutaneous TID WC  . insulin aspart  0-5 Units Subcutaneous QHS  . insulin aspart  6 Units Subcutaneous TID WC  . insulin detemir  20 Units Subcutaneous BID  . montelukast  10 mg Oral QHS  . vitamin C  500 mg Oral Daily  . zinc sulfate  220 mg Oral Daily   Continuous Infusions: . remdesivir 100 mg in NS 250 mL 100 mg (09/13/18 1019)   PRN Meds:.albuterol, chlorpheniramine-HYDROcodone, ondansetron **OR** ondansetron (ZOFRAN) IV  Antibiotics  :    Anti-infectives (From admission, onward)   Start     Dose/Rate Route Frequency Ordered Stop   09/12/18 1000  remdesivir 100 mg in sodium chloride 0.9 % 250 mL IVPB     100 mg 500 mL/hr over 30 Minutes Intravenous Every 24 hours 09/11/18 1500 09/16/18 0959   09/11/18 1400  remdesivir 200 mg in sodium chloride 0.9 % 250 mL IVPB     200 mg 500 mL/hr over 30 Minutes Intravenous Once 09/11/18 1301 09/11/18 1510   09/11/18 1000  cefTRIAXone (ROCEPHIN) 1 g in sodium chloride 0.9 % 100 mL IVPB     1 g 200 mL/hr over 30 Minutes Intravenous  Once 09/11/18 0948 09/11/18 1042   09/11/18 1000  azithromycin (ZITHROMAX) 500 mg in sodium chloride 0.9 % 250 mL IVPB     500 mg 250 mL/hr over 60 Minutes Intravenous  Once 09/11/18 0948 09/11/18 1158       Time Spent in minutes  25   Jeoffrey MassedShanker Ghimire M.D on 09/13/2018 at 3:27 PM  To page go to [email or web address] - use universal password   [facility] -  Office  [phone]  See all Orders from today for further details   Admit date - 09/11/2018    2    Objective:   Vitals:   09/13/18 0036 09/13/18 0412 09/13/18 0445 09/13/18 0800  BP:  98/64  (!) 163/94  Pulse: 82 88  66  Resp: (!) 25 (!) 25  18  Temp:  98.9 F (37.2 C)  99 F (37.2 C)  TempSrc:  Oral  Oral  SpO2: 97% 97%  98%  Weight:   90.4 kg   Height:        Wt Readings from Last 3 Encounters:  09/13/18 90.4 kg     Intake/Output Summary (Last 24 hours) at 09/13/2018 1527 Last data filed at 09/12/2018 2323 Gross per 24 hour  Intake 820 ml  Output 1025 ml  Net -205 ml     Physical Exam Gen Exam:Alert awake-not in any distress HEENT:atraumatic, normocephalic Chest: B/L clear to auscultation anteriorly CVS:S1S2 regular Abdomen:soft non tender, non distended Extremities:no edema Neurology: Non focal Skin: no rash   Data Review:    CBC Recent Labs  Lab 09/11/18 0857 09/12/18 0720 09/13/18 0210  WBC 10.9* 8.9 14.8*  HGB 13.1 11.4* 11.2*  HCT 40.0 35.5* 35.0*  PLT 276 319 368  MCV 91.1 91.5 91.1  MCH 29.8 29.4 29.2  MCHC 32.8 32.1 32.0  RDW 13.5 13.7 13.7  LYMPHSABS  --  1.3 1.9  MONOABS  --  0.1 0.7  EOSABS  --  0.0 0.0  BASOSABS  --  0.0 0.0    Chemistries  Recent Labs  Lab 09/11/18 0857 09/12/18 0720 09/13/18 0210  NA 136 139 137  K 4.3 4.2 4.0  CL 97* 104 103  CO2 27 25 25   GLUCOSE 176* 190* 173*  BUN 7 13 14   CREATININE 0.64 0.48 0.51  CALCIUM 8.6* 8.4* 8.2*  MG  --  2.5* 2.3  AST  --  51* 37  ALT  --  70* 58*  ALKPHOS  --  43 42  BILITOT  --  0.7 0.4   ------------------------------------------------------------------------------------------------------------------ Recent Labs    09/11/18 1121 09/11/18 1205  TRIG 91 89    Lab Results  Component Value Date   HGBA1C 7.2 (H) 09/12/2018    ------------------------------------------------------------------------------------------------------------------ No results for input(s): TSH, T4TOTAL, T3FREE, THYROIDAB in the last 72 hours.  Invalid input(s): FREET3 ------------------------------------------------------------------------------------------------------------------ Recent Labs    09/12/18 0720 09/13/18 0210  FERRITIN 374* 307    Coagulation profile No results for input(s): INR, PROTIME in the last 168 hours.  Recent Labs    09/12/18 0720 09/13/18 0210  DDIMER 1.21* 1.25*    Cardiac Enzymes No results for input(s): CKMB, TROPONINI, MYOGLOBIN in the last 168 hours.  Invalid input(s): CK ------------------------------------------------------------------------------------------------------------------    Component Value Date/Time   BNP 45.1 09/11/2018 0916    Micro Results Recent Results (from the past 240 hour(s))  SARS Coronavirus 2 (CEPHEID- Performed in [facility] lab), Hosp Order     Status: Abnormal   Collection Time: 09/11/18  9:16 AM   Specimen: Nasopharyngeal Swab  Result Value Ref Range Status   SARS Coronavirus 2 POSITIVE (A) NEGATIVE Final    Comment: RESULT CALLED TO, READ BACK BY AND VERIFIED WITH: DR. Criss AlvineGOLDSTON, AT 1118 09/11/18 BY D. VANHOOK (NOTE) If result is NEGATIVE SARS-CoV-2 target nucleic acids are NOT DETECTED. The SARS-CoV-2 RNA is generally detectable in upper and lower  respiratory specimens during the acute phase of infection. The lowest  concentration of SARS-CoV-2 viral copies this assay can detect is 250  copies / mL. A negative result does not preclude SARS-CoV-2 infection  and should not be used as the sole basis for treatment or other  Ruth Fox management decisions.  A negative result may occur with  improper specimen collection / handling, submission of specimen other  than nasopharyngeal swab, presence of viral mutation(s) within the  areas targeted by this  assay, and inadequate number of viral copies  (<250 copies / mL). A negative result must be combined with clinical  observations, Ruth Fox history, and epidemiological information. If result is POSITIVE SARS-CoV-2 target nucleic acids are DETE CTED. The SARS-CoV-2 RNA is generally detectable in upper and lower  respiratory specimens during the acute phase of infection.  Positive  results are indicative of active infection with SARS-CoV-2.  Clinical  correlation with Ruth Fox history and other diagnostic information is  necessary to determine Ruth Fox infection status.  Positive results do  not rule out bacterial infection or co-infection with other viruses. If result is PRESUMPTIVE POSTIVE SARS-CoV-2 nucleic acids MAY BE PRESENT.   A presumptive positive result was obtained on the submitted specimen  and confirmed on repeat testing.  While 2019 novel coronavirus  (SARS-CoV-2) nucleic acids may be present in the submitted sample  additional confirmatory testing may be necessary for epidemiological  and / or clinical management purposes  to differentiate between  SARS-CoV-2 and other Sarbecovirus  currently known to infect humans.  If clinically indicated additional testing with an alternate test  methodology (LAB [phone]) is advised. The SARS-CoV-2 RNA is generally  detectable in upper and lower respiratory specimens during the acute  phase of infection. The expected result is Negative. Fact Sheet for Patients:  StrictlyIdeas.no Fact Sheet for Healthcare Providers: BankingDealers.co.za This test is not yet approved or cleared by the [place] FDA and has been authorized for detection and/or diagnosis of SARS-CoV-2 by FDA under an Emergency Use Authorization (EUA).  This EUA will remain in effect (meaning this test can be used) for the duration of the COVID-19 declaration under Section 564(b)(1) of the Act, 21 U.S.C. section 360bbb-3(b)(1),  unless the authorization is terminated or revoked sooner. Performed at [facility], [place] [address], [place], [place] [address]   Culture, blood (routine x 2)     Status: None (Preliminary result)   Collection Time: 09/11/18  9:35 AM   Specimen: BLOOD  Result Value Ref Range Status   Specimen Description BLOOD LEFT ANTECUBITAL  Final   Special Requests   Final    BOTTLES DRAWN AEROBIC AND ANAEROBIC Blood Culture adequate volume   Culture   Final    NO GROWTH 2 DAYS Performed at [facility], [place] [address] [place], [place] [address]    Report Status PENDING  Incomplete  Culture, blood (routine x 2)     Status: None (Preliminary result)   Collection Time: 09/11/18  9:48 AM   Specimen: BLOOD  Result Value Ref Range Status   Specimen Description BLOOD RIGHT ANTECUBITAL  Final   Special Requests   Final    BOTTLES DRAWN AEROBIC ONLY Blood Culture results may not be optimal due to an inadequate volume of blood received in culture bottles   Culture   Final    NO GROWTH 2 DAYS Performed at [facility], [place] [address], [place], [place] [address]    Report Status PENDING  Incomplete    Radiology Reports Dg Chest Portable 1 View  Result Date: 09/11/2018 CLINICAL DATA:  Hypoxia.  Chest tightness.  History of asthma. EXAM: PORTABLE CHEST 1 VIEW COMPARISON:  None. FINDINGS: Mild patchy opacities in the bases, left greater than right. No pneumothorax. The cardiomediastinal silhouette is unremarkable. No other acute abnormalities. IMPRESSION: Mild patchy opacities in the bases, left greater than right, may represent developing pneumonia. Atypical infections should be considered. No other acute abnormalities are identified. Electronically Signed   By: Dorise Bullion III M.D   On: 09/11/2018 09:41

## 2018-09-13 NOTE — Progress Notes (Signed)
Phoned the pt's daughter, to update per the request of the Pt, she would like her daughter to be the main contact.  Advised that the pt is doing well, she is on 5 L, but I was going to try and wean her down today.Marland Kitchen also she is getting remdesivir until Friday 7/25.

## 2018-09-14 DIAGNOSIS — J1289 Other viral pneumonia: Secondary | ICD-10-CM

## 2018-09-14 LAB — CBC WITH DIFFERENTIAL/PLATELET
Abs Immature Granulocytes: 0.49 10*3/uL — ABNORMAL HIGH (ref 0.00–0.07)
Basophils Absolute: 0 10*3/uL (ref 0.0–0.1)
Basophils Relative: 0 %
Eosinophils Absolute: 0 10*3/uL (ref 0.0–0.5)
Eosinophils Relative: 0 %
HCT: 35.6 % — ABNORMAL LOW (ref 36.0–46.0)
Hemoglobin: 11.8 g/dL — ABNORMAL LOW (ref 12.0–15.0)
Immature Granulocytes: 4 %
Lymphocytes Relative: 17 %
Lymphs Abs: 2.2 10*3/uL (ref 0.7–4.0)
MCH: 29.6 pg (ref 26.0–34.0)
MCHC: 33.1 g/dL (ref 30.0–36.0)
MCV: 89.2 fL (ref 80.0–100.0)
Monocytes Absolute: 0.8 10*3/uL (ref 0.1–1.0)
Monocytes Relative: 6 %
Neutro Abs: 9.5 10*3/uL — ABNORMAL HIGH (ref 1.7–7.7)
Neutrophils Relative %: 73 %
Platelets: 387 10*3/uL (ref 150–400)
RBC: 3.99 MIL/uL (ref 3.87–5.11)
RDW: 13.2 % (ref 11.5–15.5)
WBC: 13 10*3/uL — ABNORMAL HIGH (ref 4.0–10.5)
nRBC: 0 % (ref 0.0–0.2)

## 2018-09-14 LAB — COMPREHENSIVE METABOLIC PANEL
ALT: 61 U/L — ABNORMAL HIGH (ref 0–44)
AST: 45 U/L — ABNORMAL HIGH (ref 15–41)
Albumin: 3.1 g/dL — ABNORMAL LOW (ref 3.5–5.0)
Alkaline Phosphatase: 42 U/L (ref 38–126)
Anion gap: 9 (ref 5–15)
BUN: 15 mg/dL (ref 6–20)
CO2: 26 mmol/L (ref 22–32)
Calcium: 8.5 mg/dL — ABNORMAL LOW (ref 8.9–10.3)
Chloride: 103 mmol/L (ref 98–111)
Creatinine, Ser: 0.51 mg/dL (ref 0.44–1.00)
GFR calc Af Amer: >60 mL/min (ref >60)
GFR calc non Af Amer: >60 mL/min (ref >60)
Glucose, Bld: 128 mg/dL — ABNORMAL HIGH (ref 70–99)
Potassium: 3.6 mmol/L (ref 3.5–5.1)
Sodium: 138 mmol/L (ref 135–145)
Total Bilirubin: 0.4 mg/dL (ref 0.3–1.2)
Total Protein: 6.6 g/dL (ref 6.5–8.1)

## 2018-09-14 LAB — FERRITIN: Ferritin: 246 ng/mL (ref 11–307)

## 2018-09-14 LAB — GLUCOSE, CAPILLARY
Glucose-Capillary: 113 mg/dL — ABNORMAL HIGH (ref 70–99)
Glucose-Capillary: 248 mg/dL — ABNORMAL HIGH (ref 70–99)
Glucose-Capillary: 245 mg/dL — ABNORMAL HIGH (ref 70–99)
Glucose-Capillary: 238 mg/dL — ABNORMAL HIGH (ref 70–99)

## 2018-09-14 LAB — D-DIMER, QUANTITATIVE: D-Dimer, Quant: 1.28 ug/mL-FEU — ABNORMAL HIGH (ref 0.00–0.50)

## 2018-09-14 LAB — HEPATITIS B SURFACE ANTIGEN: Hepatitis B Surface Ag: NEGATIVE

## 2018-09-14 LAB — C-REACTIVE PROTEIN: CRP: 1.9 mg/dL — ABNORMAL HIGH (ref <1.0)

## 2018-09-14 LAB — LACTATE DEHYDROGENASE: LDH: 244 U/L — ABNORMAL HIGH (ref 98–192)

## 2018-09-14 LAB — MAGNESIUM: Magnesium: 2.1 mg/dL (ref 1.7–2.4)

## 2018-09-14 MED ORDER — INSULIN ASPART PROT & ASPART (70-30 MIX) 100 UNIT/ML ~~LOC~~ SUSP
20.0000 [IU] | Freq: Two times a day (BID) | SUBCUTANEOUS | Status: DC
  Administered 2018-09-14 – 2018-09-15 (×2): 20 [IU] via SUBCUTANEOUS
  Filled 2018-09-14: qty 10

## 2018-09-14 MED ORDER — LIVING WELL WITH DIABETES BOOK - IN SPANISH
Freq: Once | Status: DC
  Filled 2018-09-14: qty 1

## 2018-09-14 MED ORDER — INSULIN STARTER KIT- SYRINGES (SPANISH)
1.0000 | Freq: Once | Status: DC
  Filled 2018-09-14: qty 1

## 2018-09-14 MED ORDER — INSULIN STARTER KIT- SYRINGES (ENGLISH)
1.0000 | Freq: Once | Status: DC
  Filled 2018-09-14: qty 1

## 2018-09-14 MED ORDER — LIVING WELL WITH DIABETES BOOK
Freq: Once | Status: AC
  Administered 2018-09-14: 17:00:00
  Filled 2018-09-14: qty 1

## 2018-09-14 NOTE — Plan of Care (Signed)
  Problem: Education: Goal: Knowledge of General Education information will improve Description: Including pain rating scale, medication(s)/side effects and non-pharmacologic comfort measures Outcome: Progressing   Problem: Health Behavior/Discharge Planning: Goal: Ability to manage health-related needs will improve Outcome: Progressing   Problem: Clinical Measurements: Goal: Ability to maintain clinical measurements within normal limits will improve Outcome: Progressing Goal: Will remain free from infection Outcome: Progressing Goal: Respiratory complications will improve Outcome: Progressing   Problem: Pain Managment: Goal: General experience of comfort will improve Outcome: Progressing   Problem: Safety: Goal: Ability to remain free from injury will improve Outcome: Progressing   

## 2018-09-14 NOTE — Progress Notes (Signed)
PROGRESS NOTE                                                                                                                                                                                                             Patient Demographics:    Ruth Fox, is a 43 y.o. female, DOB - 11/06/1975, EAV:[id number]  Outpatient Primary MD for the patient is Patient, No Pcp Per    LOS - 3  Chief Complaint  Patient presents with  . Chest Pain  . Shortness of Breath       Brief Narrative: Patient is a 43 y.o. female with PMHx of DM-2, bronchial asthma-presenting to the ED with acute hypoxemic respiratory failure secondary to COVID-19 pneumonia.   Subjective:    Ruth ArvinReyna Fox today feels much better-she was titrated to room air early this morning-and O2 saturations remained stable in the 90s.   Assessment  & Plan :   Acute Hypoxic Resp Failure due to Covid 19 Viral pneumonia: Better-titrated to room air this morning (was on 5 L) yesterday.  Patient is s/p Actemra on 7/21.  Continue IV Decadron-remains on Remdesivir-to complete 5-day course on 7/24.  If clinical improvement continues-suspect could be potentially discharged home on 7/24.  May need home O2-have asked RN to ambulate.   COVID-19 Labs:  Recent Labs    09/12/18 0720 09/13/18 0210 09/14/18 0300 09/14/18 0400  DDIMER 1.21* 1.25* 1.28*  --   FERRITIN 374* 307  --  246  LDH 256* 260* 244*  --   CRP 9.7* 4.6*  --  1.9*    Lab Results  Component Value Date   SARSCOV2NAA POSITIVE (A) 09/11/2018     COVID-19 Medications: 7/20>> Remdesivir 7/20>> Decadron 7/20>> Actemra  DM-2: CBG stable but still fluctuating-probably secondary to steroids.  A1c 7.2.  Will switch over to insulin 70/30 today-nursing staff asked to provide insulin administration education.  If she continues to have hyperglycemia-probably will require initiation of insulin on discharge.  Will  resume metformin on discharge.    Bronchial asthma: Stable-continue with as needed bronchodilators  ABG: No results found for: PHART, PCO2ART, PO2ART, HCO3, TCO2, ACIDBASEDEF, O2SAT  Condition - Stable  Family Communication  :  Daughter updated over the phone on 7/23  Code Status :  Full Code  Diet :  Diet Order  Diet regular Room service appropriate? Yes; Fluid consistency: Thin  Diet effective now               Disposition Plan  :  Remain inpatient-probable discharge on 7/24  Consults  : None Procedures  :  None  DVT Prophylaxis  :  Lovenox   Lab Results  Component Value Date   PLT 387 09/14/2018    Inpatient Medications  Scheduled Meds: . albuterol  2 puff Inhalation Q4H  . dexamethasone  6 mg Oral Daily  . enoxaparin (LOVENOX) injection  40 mg Subcutaneous Q24H  . famotidine  20 mg Oral BID  . insulin aspart  0-20 Units Subcutaneous TID WC  . insulin aspart  0-5 Units Subcutaneous QHS  . insulin aspart  6 Units Subcutaneous TID WC  . insulin detemir  20 Units Subcutaneous BID  . montelukast  10 mg Oral QHS  . vitamin C  500 mg Oral Daily  . zinc sulfate  220 mg Oral Daily   Continuous Infusions: . remdesivir 100 mg in NS 250 mL 100 mg (09/14/18 1102)   PRN Meds:.albuterol, chlorpheniramine-HYDROcodone, ondansetron **OR** ondansetron (ZOFRAN) IV  Antibiotics  :    Anti-infectives (From admission, onward)   Start     Dose/Rate Route Frequency Ordered Stop   09/12/18 1000  remdesivir 100 mg in sodium chloride 0.9 % 250 mL IVPB     100 mg 500 mL/hr over 30 Minutes Intravenous Every 24 hours 09/11/18 1500 09/16/18 0959   09/11/18 1400  remdesivir 200 mg in sodium chloride 0.9 % 250 mL IVPB     200 mg 500 mL/hr over 30 Minutes Intravenous Once 09/11/18 1301 09/11/18 1510   09/11/18 1000  cefTRIAXone (ROCEPHIN) 1 g in sodium chloride 0.9 % 100 mL IVPB     1 g 200 mL/hr over 30 Minutes Intravenous  Once 09/11/18 0948 09/11/18 1042   09/11/18  1000  azithromycin (ZITHROMAX) 500 mg in sodium chloride 0.9 % 250 mL IVPB     500 mg 250 mL/hr over 60 Minutes Intravenous  Once 09/11/18 0948 09/11/18 1158       Time Spent in minutes  25   Oren Binet M.D on 09/14/2018 at 12:17 PM  To page go to [email or web address] - use universal password  [facility] -  Office  [phone]  See all Orders from today for further details   Admit date - 09/11/2018    3    Objective:   Vitals:   09/14/18 0737 09/14/18 0800 09/14/18 1000 09/14/18 1100  BP: 106/67     Pulse: 82 67 64 66  Resp:      Temp: 97.6 F (36.4 C)     TempSrc: Oral     SpO2: 96% 94% 97% 96%  Weight:      Height:        Wt Readings from Last 3 Encounters:  09/13/18 90.4 kg     Intake/Output Summary (Last 24 hours) at 09/14/2018 1217 Last data filed at 09/13/2018 1900 Gross per 24 hour  Intake 250 ml  Output -  Net 250 ml     Physical Exam Physical Exam Gen Exam:Alert awake-not in any distress HEENT:atraumatic, normocephalic Chest: B/L clear to auscultation anteriorly CVS:S1S2 regular Abdomen:soft non tender, non distended Extremities:no edema Neurology: Non focal Skin: no rash   Data Review:    CBC Recent Labs  Lab 09/11/18 0857 09/11/18 1215 09/12/18 0720 09/13/18 0210 09/14/18 0300  WBC 10.9*  --  8.9 14.8*  13.0*  HGB 13.1 11.7 11.4* 11.2* 11.8*  HCT 40.0  --  35.5* 35.0* 35.6*  PLT 276  --  319 368 387  MCV 91.1  --  91.5 91.1 89.2  MCH 29.8  --  29.4 29.2 29.6  MCHC 32.8  --  32.1 32.0 33.1  RDW 13.5  --  13.7 13.7 13.2  LYMPHSABS  --   --  1.3 1.9 2.2  MONOABS  --   --  0.1 0.7 0.8  EOSABS  --   --  0.0 0.0 0.0  BASOSABS  --   --  0.0 0.0 0.0    Chemistries  Recent Labs  Lab 09/11/18 0857 09/12/18 0720 09/13/18 0210 09/14/18 0300  NA 136 139 137 138  K 4.3 4.2 4.0 3.6  CL 97* 104 103 103  CO2 27 25 25 26   GLUCOSE 176* 190* 173* 128*  BUN 7 13 14 15   CREATININE 0.64 0.48 0.51 0.51  CALCIUM 8.6* 8.4* 8.2*  8.5*  MG  --  2.5* 2.3 2.1  AST  --  51* 37 45*  ALT  --  70* 58* 61*  ALKPHOS  --  43 42 42  BILITOT  --  0.7 0.4 0.4   ------------------------------------------------------------------------------------------------------------------ No results for input(s): CHOL, HDL, LDLCALC, TRIG, CHOLHDL, LDLDIRECT in the last 72 hours.  Lab Results  Component Value Date   HGBA1C 7.2 (H) 09/12/2018   ------------------------------------------------------------------------------------------------------------------ No results for input(s): TSH, T4TOTAL, T3FREE, THYROIDAB in the last 72 hours.  Invalid input(s): FREET3 ------------------------------------------------------------------------------------------------------------------ Recent Labs    09/13/18 0210 09/14/18 0400  FERRITIN 307 246    Coagulation profile No results for input(s): INR, PROTIME in the last 168 hours.  Recent Labs    09/13/18 0210 09/14/18 0300  DDIMER 1.25* 1.28*    Cardiac Enzymes No results for input(s): CKMB, TROPONINI, MYOGLOBIN in the last 168 hours.  Invalid input(s): CK ------------------------------------------------------------------------------------------------------------------    Component Value Date/Time   BNP 45.1 09/11/2018 0916    Micro Results Recent Results (from the past 240 hour(s))  SARS Coronavirus 2 (CEPHEID- Performed in [facility] hospital lab), Hosp Order     Status: Abnormal   Collection Time: 09/11/18  9:16 AM   Specimen: Nasopharyngeal Swab  Result Value Ref Range Status   SARS Coronavirus 2 POSITIVE (A) NEGATIVE Final    Comment: RESULT CALLED TO, READ BACK BY AND VERIFIED WITH: DR. Criss AlvineGOLDSTON, AT 1118 09/11/18 BY D. VANHOOK (NOTE) If result is NEGATIVE SARS-CoV-2 target nucleic acids are NOT DETECTED. The SARS-CoV-2 RNA is generally detectable in upper and lower  respiratory specimens during the acute phase of infection. The lowest  concentration of SARS-CoV-2 viral  copies this assay can detect is 250  copies / mL. A negative result does not preclude SARS-CoV-2 infection  and should not be used as the sole basis for treatment or other  patient management decisions.  A negative result may occur with  improper specimen collection / handling, submission of specimen other  than nasopharyngeal swab, presence of viral mutation(s) within the  areas targeted by this assay, and inadequate number of viral copies  (<250 copies / mL). A negative result must be combined with clinical  observations, patient history, and epidemiological information. If result is POSITIVE SARS-CoV-2 target nucleic acids are DETE CTED. The SARS-CoV-2 RNA is generally detectable in upper and lower  respiratory specimens during the acute phase of infection.  Positive  results are indicative of active infection with SARS-CoV-2.  Clinical  correlation with patient history and other diagnostic information is  necessary to determine patient infection status.  Positive results do  not rule out bacterial infection or co-infection with other viruses. If result is PRESUMPTIVE POSTIVE SARS-CoV-2 nucleic acids MAY BE PRESENT.   A presumptive positive result was obtained on the submitted specimen  and confirmed on repeat testing.  While 2019 novel coronavirus  (SARS-CoV-2) nucleic acids may be present in the submitted sample  additional confirmatory testing may be necessary for epidemiological  and / or clinical management purposes  to differentiate between  SARS-CoV-2 and other Sarbecovirus currently known to infect humans.  If clinically indicated additional testing with an alternate test  methodology (LAB [phone]) is advised. The SARS-CoV-2 RNA is generally  detectable in upper and lower respiratory specimens during the acute  phase of infection. The expected result is Negative. Fact Sheet for Patients:  BoilerBrush.com.cyhttps://www.fda.gov/media/136312/download Fact Sheet for Healthcare Providers:  https://pope.com/https://www.fda.gov/media/136313/download This test is not yet approved or cleared by the [place] FDA and has been authorized for detection and/or diagnosis of SARS-CoV-2 by FDA under an Emergency Use Authorization (EUA).  This EUA will remain in effect (meaning this test can be used) for the duration of the COVID-19 declaration under Section 564(b)(1) of the Act, 21 U.S.C. section 360bbb-3(b)(1), unless the authorization is terminated or revoked sooner. Performed at [facility], [address] [place], [place] [address]   Culture, blood (routine x 2)     Status: None (Preliminary result)   Collection Time: 09/11/18  9:35 AM   Specimen: BLOOD  Result Value Ref Range Status   Specimen Description BLOOD LEFT ANTECUBITAL  Final   Special Requests   Final    BOTTLES DRAWN AEROBIC AND ANAEROBIC Blood Culture adequate volume   Culture   Final    NO GROWTH 3 DAYS Performed at [facility], [address] [place], [place] [address]    Report Status PENDING  Incomplete  Culture, blood (routine x 2)     Status: None (Preliminary result)   Collection Time: 09/11/18  9:48 AM   Specimen: BLOOD  Result Value Ref Range Status   Specimen Description BLOOD RIGHT ANTECUBITAL  Final   Special Requests   Final    BOTTLES DRAWN AEROBIC ONLY Blood Culture results may not be optimal due to an inadequate volume of blood received in culture bottles   Culture   Final    NO GROWTH 3 DAYS Performed at [facility], [address] [place], [place] [address]    Report Status PENDING  Incomplete    Radiology Reports Dg Chest Portable 1 View  Result Date: 09/11/2018 CLINICAL DATA:  Hypoxia.  Chest tightness.  History of asthma. EXAM: PORTABLE CHEST 1 VIEW COMPARISON:  None. FINDINGS: Mild patchy opacities in the bases, left greater than right. No pneumothorax. The cardiomediastinal silhouette is unremarkable. No other acute abnormalities. IMPRESSION: Mild patchy opacities in the  bases, left greater than right, may represent developing pneumonia. Atypical infections should be considered. No other acute abnormalities are identified. Electronically Signed   By: Gerome Samavid  Williams III M.D   On: 09/11/2018 09:41

## 2018-09-14 NOTE — Progress Notes (Signed)
Pt up walking in hall.  HR in the 70's,  Lowest her Oxygen level was 88%, but recovered quickly back into the mid 90's when stopped and rest for approximately 30 seconds.  Pt denied any SOB or fatigue, said she felt fine during the ambulation.

## 2018-09-14 NOTE — Progress Notes (Signed)
Inpatient Diabetes Program Recommendations  AACE/ADA: New Consensus Statement on Inpatient Glycemic Control (2015)  Target Ranges:  Prepandial:   less than 140 mg/dL      Peak postprandial:   less than 180 mg/dL (1-2 hours)      Critically ill patients:  140 - 180 mg/dL   Lab Results  Component Value Date   GLUCAP 248 (H) 09/14/2018   HGBA1C 7.2 (H) 09/12/2018    Review of Glycemic Control  Diabetes history: DM2 Outpatient Diabetes medications: None - prescribed metformin 500 mg bid but has not started yet) Current orders for Inpatient glycemic control: 70/30 20 units bid, Novolog 0-20 units tidwc  On Decadron 6 mg QD   Inpatient Diabetes Program Recommendations:     Agree with orders. May need HS correction. Ordered Living Well with Diabetes book, Insulin Starter Kit. Secure text with RN to start teaching insulin admin. Ordered care management consult for assistance with obtaining PCP for diabetes management, and meds at discharge with no insurance.   Will follow closely. For likely d/c on 7/24.  Thank you. Lorenda Peck, RD, LDN, CDE Inpatient Diabetes Coordinator [phone]

## 2018-09-14 NOTE — TOC Initial Note (Addendum)
Transition of Care [facility]) - Initial/Assessment Note    Patient Details  Name: Ruth Fox MRN: [id number] Date of Birth: 07/13/1975  Transition of Care [facility]) CM/SW Contact:    Midge Minium RN, BSN, NCM-BC, ACM-RN [phone] (working remotely) Phone Number: 09/14/2018, 4:32 PM  Clinical Narrative:                 CM consult acknowledged to assist with arranging a PCP/medication needs. Numerous attempts by CM to contact the patient on her hospital room phone/cellphone to discuss the POC. Hospital telephone f/u appointment scheduled at: [facility] on October 10, 2018 @ 0930; AVS updated. Patient is married/employed, will discuss her ability to afford her Rx before discharge. CM team will continue to follow.   Expected Discharge Plan: Home/Self Care Barriers to Discharge: Continued Medical Work up     Expected Discharge Plan and Services Expected Discharge Plan: Home/Self Care In-house Referral: PCP / Health Connect Discharge Planning Services: CM Consult, [facility], Medication Assistance, Follow-up appt scheduled   Living arrangements for the past 2 months: Single Family Home  Prior Living Arrangements/Services Living arrangements for the past 2 months: Single Family Home Lives with:: Self, Spouse    Activities of Daily Living Home Assistive Devices/Equipment: None ADL Screening (condition at time of admission) Patient's cognitive ability adequate to safely complete daily activities?: Yes Is the patient deaf or have difficulty hearing?: No Does the patient have difficulty seeing, even when wearing glasses/contacts?: No Does the patient have difficulty concentrating, remembering, or making decisions?: No Patient able to express need for assistance with ADLs?: Yes Does the patient have difficulty dressing or bathing?: No Independently performs ADLs?: Yes (appropriate for developmental age) Does the patient have difficulty walking or climbing stairs?: No Weakness  of Legs: Both Weakness of Arms/Hands: None   Admission diagnosis:  Acute respiratory failure with hypoxia ([place]) [J96.01] COVID-19 virus infection [U07.1] Patient Active Problem List   Diagnosis Date Noted  . Pneumonia due to COVID-19 virus 09/11/2018  . Acute respiratory failure with hypoxia ([place]) 09/11/2018  . SIRS (systemic inflammatory response syndrome) ([place]) 09/11/2018  . History of asthma 09/11/2018  . Hyperglycemia 09/11/2018   PCP:  Patient, No Pcp Per Pharmacy:   [facility], [address] [place] [place] [address] Phone: [phone] Fax: [phone]     Social Determinants of Health (SDOH) Interventions    Readmission Risk Interventions No flowsheet data found.

## 2018-09-15 DIAGNOSIS — R739 Hyperglycemia, unspecified: Secondary | ICD-10-CM

## 2018-09-15 DIAGNOSIS — J9601 Acute respiratory failure with hypoxia: Secondary | ICD-10-CM

## 2018-09-15 DIAGNOSIS — U071 COVID-19: Principal | ICD-10-CM

## 2018-09-15 DIAGNOSIS — Z8709 Personal history of other diseases of the respiratory system: Secondary | ICD-10-CM

## 2018-09-15 LAB — CBC WITH DIFFERENTIAL/PLATELET
Abs Immature Granulocytes: 0.71 10*3/uL — ABNORMAL HIGH (ref 0.00–0.07)
Basophils Absolute: 0.1 10*3/uL (ref 0.0–0.1)
Basophils Relative: 1 %
Eosinophils Absolute: 0 10*3/uL (ref 0.0–0.5)
Eosinophils Relative: 0 %
HCT: 37.5 % (ref 36.0–46.0)
Hemoglobin: 12.4 g/dL (ref 12.0–15.0)
Immature Granulocytes: 5 %
Lymphocytes Relative: 21 %
Lymphs Abs: 2.7 10*3/uL (ref 0.7–4.0)
MCH: 29.3 pg (ref 26.0–34.0)
MCHC: 33.1 g/dL (ref 30.0–36.0)
MCV: 88.7 fL (ref 80.0–100.0)
Monocytes Absolute: 0.8 10*3/uL (ref 0.1–1.0)
Monocytes Relative: 6 %
Neutro Abs: 8.8 10*3/uL — ABNORMAL HIGH (ref 1.7–7.7)
Neutrophils Relative %: 67 %
Platelets: 375 10*3/uL (ref 150–400)
RBC: 4.23 MIL/uL (ref 3.87–5.11)
RDW: 13.3 % (ref 11.5–15.5)
WBC: 13.1 10*3/uL — ABNORMAL HIGH (ref 4.0–10.5)
nRBC: 0.2 % (ref 0.0–0.2)

## 2018-09-15 LAB — COMPREHENSIVE METABOLIC PANEL
ALT: 73 U/L — ABNORMAL HIGH (ref 0–44)
AST: 55 U/L — ABNORMAL HIGH (ref 15–41)
Albumin: 3.2 g/dL — ABNORMAL LOW (ref 3.5–5.0)
Alkaline Phosphatase: 42 U/L (ref 38–126)
Anion gap: 10 (ref 5–15)
BUN: 16 mg/dL (ref 6–20)
CO2: 26 mmol/L (ref 22–32)
Calcium: 8.3 mg/dL — ABNORMAL LOW (ref 8.9–10.3)
Chloride: 99 mmol/L (ref 98–111)
Creatinine, Ser: 0.5 mg/dL (ref 0.44–1.00)
GFR calc Af Amer: >60 mL/min (ref >60)
GFR calc non Af Amer: >60 mL/min (ref >60)
Glucose, Bld: 119 mg/dL — ABNORMAL HIGH (ref 70–99)
Potassium: 3.7 mmol/L (ref 3.5–5.1)
Sodium: 135 mmol/L (ref 135–145)
Total Bilirubin: 0.4 mg/dL (ref 0.3–1.2)
Total Protein: 6.5 g/dL (ref 6.5–8.1)

## 2018-09-15 LAB — GLUCOSE, CAPILLARY
Glucose-Capillary: 106 mg/dL — ABNORMAL HIGH (ref 70–99)
Glucose-Capillary: 165 mg/dL — ABNORMAL HIGH (ref 70–99)

## 2018-09-15 LAB — D-DIMER, QUANTITATIVE: D-Dimer, Quant: 0.89 ug/mL-FEU — ABNORMAL HIGH (ref 0.00–0.50)

## 2018-09-15 LAB — MAGNESIUM: Magnesium: 2.1 mg/dL (ref 1.7–2.4)

## 2018-09-15 LAB — LACTATE DEHYDROGENASE: LDH: 261 U/L — ABNORMAL HIGH (ref 98–192)

## 2018-09-15 LAB — FERRITIN: Ferritin: 263 ng/mL (ref 11–307)

## 2018-09-15 LAB — C-REACTIVE PROTEIN: CRP: 1 mg/dL — ABNORMAL HIGH (ref <1.0)

## 2018-09-15 MED ORDER — ALBUTEROL SULFATE HFA 108 (90 BASE) MCG/ACT IN AERS: 2.0000 | INHALATION_SPRAY | RESPIRATORY_TRACT | 0 refills | Status: DC

## 2018-09-15 MED ORDER — BENZONATATE 100 MG PO CAPS: 100.0000 mg | ORAL_CAPSULE | Freq: Three times a day (TID) | ORAL | 0 refills | Status: DC | PRN

## 2018-09-15 MED ORDER — BLOOD GLUCOSE METER KIT: PACK | 0 refills | Status: AC

## 2018-09-15 MED ORDER — METFORMIN HCL 500 MG PO TABS: 500.0000 mg | ORAL_TABLET | Freq: Two times a day (BID) | ORAL | 0 refills | Status: AC

## 2018-09-15 MED ORDER — BLOOD GLUCOSE METER KIT: PACK | 0 refills | Status: DC

## 2018-09-15 MED ORDER — DEXAMETHASONE 6 MG PO TABS: 6.0000 mg | ORAL_TABLET | Freq: Every day | ORAL | 0 refills | Status: AC

## 2018-09-15 MED ORDER — INSULIN NPH ISOPHANE & REGULAR (70-30) 100 UNIT/ML ~~LOC~~ SUSP: 12.0000 [IU] | Freq: Two times a day (BID) | SUBCUTANEOUS | 0 refills | Status: AC

## 2018-09-15 NOTE — TOC Transition Note (Signed)
Transition of Care [facility]) - CM/SW Discharge Note   Patient Details  Name: Zykia Walla MRN: [id number] Date of Birth: 11-Feb-1976  Transition of Care [facility]) CM/SW Contact:  Midge Minium RN, BSN, NCM-BC, ACM-RN [phone] (working remotely) Phone Number: 09/15/2018, 9:11 AM   Clinical Narrative:    CM spoke to the patient via phone to discuss the POC. CM informed the patient of the hospital f/u appointment arranged at [facility]. DM medications for insulin pens are $44 at [facility]; CM discussed with the patient and she stated her ability to afford; patient can then follow-up with [facility] for her Rx needs ([facility] unable to dispense directly, on day of discharge, to patients who are still COVID +). No further needs from CM.    Final next level of care: Home/Self Care Barriers to Discharge: No Barriers Identified    Discharge Plan and Services In-house Referral: PCP / Health Connect Discharge Planning Services: CM Consult, [facility], Medication Assistance, Follow-up appt scheduled

## 2018-09-15 NOTE — Progress Notes (Signed)
Patient discharge teaching given, including activity, diet, follow-up appoints, and medications. Patient verbalized understanding of all discharge instructions. IV access was d/c'd. Vitals are stable. Skin is intact except as charted in most recent assessments. Pt to be escorted out by RN, to be driven home by family. Waiting on rx to be reprinted then pt. Will dc.

## 2018-09-15 NOTE — Discharge Instructions (Signed)
Acute Respiratory Failure, Adult  Acute respiratory failure occurs when there is not enough oxygen passing from your lungs to your body. When this happens, your lungs have trouble removing carbon dioxide from the blood. This causes your blood oxygen level to drop too low as carbon dioxide builds up. Acute respiratory failure is a medical emergency. It can develop quickly, but it is temporary if treated promptly. Your lung capacity, or how much air your lungs can hold, may improve with time, exercise, and treatment. What are the causes? There are many possible causes of acute respiratory failure, including:  Lung injury.  Chest injury or damage to the ribs or tissues near the lungs.  Lung conditions that affect the flow of air and blood into and out of the lungs, such as pneumonia, acute respiratory distress syndrome, and cystic fibrosis.  Medical conditions, such as strokes or spinal cord injuries, that affect the muscles and nerves that control breathing.  Blood infection (sepsis).  Inflammation of the pancreas (pancreatitis).  A blood clot in the lungs (pulmonary embolism).  A large-volume blood transfusion.  Burns.  Near-drowning.  Seizure.  Smoke inhalation.  Reaction to medicines.  Alcohol or drug overdose. What increases the risk? This condition is more likely to develop in people who have:  A blocked airway.  Asthma.  A condition or disease that damages or weakens the muscles, nerves, bones, or tissues that are involved in breathing.  A serious infection.  A health problem that blocks the unconscious reflex that is involved in breathing, such as hypothyroidism or sleep apnea.  A lung injury or trauma. What are the signs or symptoms? Trouble breathing is the main symptom of acute respiratory failure. Symptoms may also include:  Rapid breathing.  Restlessness or anxiety.  Skin, lips, or fingernails that appear blue (cyanosis).  Rapid heart  rate.  Abnormal heart rhythms (arrhythmias).  Confusion or changes in behavior.  Tiredness or loss of energy.  Feeling sleepy or having a loss of consciousness. How is this diagnosed? Your health care provider can diagnose acute respiratory failure with a medical history and physical exam. During the exam, your health care provider will listen to your heart and check for crackling or wheezing sounds in your lungs. Your may also have tests to confirm the diagnosis and determine what is causing respiratory failure. These tests may include:  Measuring the amount of oxygen in your blood (pulse oximetry). The measurement comes from a small device that is placed on your finger, earlobe, or toe.  Other blood tests to measure blood gases and to look for signs of infection.  Sampling your cerebral spinal fluid or tracheal fluid to check for infections.  Chest X-ray to look for fluid in spaces that should be filled with air.  Electrocardiogram (ECG) to look at the heart's electrical activity. How is this treated? Treatment for this condition usually takes places in a hospital intensive care unit (ICU). Treatment depends on what is causing the condition. It may include one or more treatments until your symptoms improve. Treatment may include:  Supplemental oxygen. Extra oxygen is given through a tube in the nose, a face mask, or a hood.  A device such as a continuous positive airway pressure (CPAP) or bi-level positive airway pressure (BiPAP or BPAP) machine. This treatment uses mild air pressure to keep the airways open. A mask or other device will be placed over your nose or mouth. A tube that is connected to a motor will deliver oxygen through  the mask.  Ventilator. This treatment helps move air into and out of the lungs. This may be done with a bag and mask or a machine. For this treatment, a tube is placed in your windpipe (trachea) so air and oxygen can flow to the lungs.  Extracorporeal  membrane oxygenation (ECMO). This treatment temporarily takes over the function of the heart and lungs, supplying oxygen and removing carbon dioxide. ECMO gives the lungs a chance to recover. It may be used if a ventilator is not effective.  Tracheostomy. This is a procedure that creates a hole in the neck to insert a breathing tube.  Receiving fluids and medicines.  Rocking the bed to help breathing. Follow these instructions at home:  Take over-the-counter and prescription medicines only as told by your health care provider.  Return to normal activities as told by your health care provider. Ask your health care provider what activities are safe for you.  Keep all follow-up visits as told by your health care provider. This is important. How is this prevented? Treating infections and medical conditions that may lead to acute respiratory failure can help prevent the condition from developing. Contact a health care provider if:  You have a fever.  Your symptoms do not improve or they get worse. Get help right away if:  You are having trouble breathing.  You lose consciousness.  Your have cyanosis or turn blue.  You develop a rapid heart rate.  You are confused. These symptoms may represent a serious problem that is an emergency. Do not wait to see if the symptoms will go away. Get medical help right away. Call your local emergency services (911 in the [place] Do not drive yourself to the hospital. This information is not intended to replace advice given to you by your health care provider. Make sure you discuss any questions you have with your health care provider. Document Released: 02/13/2013 Document Revised: 01/21/2017 Document Reviewed: 08/27/2015 Elsevier Patient Education  2020 Elsevier Inc.    Person Under Monitoring Name: Lynford CitizenReyna Gonzalez Tow  Location: [address] [place] [id number]   Infection Prevention Recommendations for Individuals Confirmed to have, or  Being Evaluated for, 2019 Novel Coronavirus (COVID-19) Infection Who Receive Care at Home  Individuals who are confirmed to have, or are being evaluated for, COVID-19 should follow the prevention steps below until a healthcare provider or local or state health department says they can return to normal activities.  Stay home except to get medical care You should restrict activities outside your home, except for getting medical care. Do not go to work, school, or public areas, and do not use public transportation or taxis.  Call ahead before visiting your doctor Before your medical appointment, call the healthcare provider and tell them that you have, or are being evaluated for, COVID-19 infection. This will help the healthcare providers office take steps to keep other people from getting infected. Ask your healthcare provider to call the local or state health department.  Monitor your symptoms Seek prompt medical attention if your illness is worsening (e.g., difficulty breathing). Before going to your medical appointment, call the healthcare provider and tell them that you have, or are being evaluated for, COVID-19 infection. Ask your healthcare provider to call the local or state health department.  Wear a facemask You should wear a facemask that covers your nose and mouth when you are in the same room with other people and when you visit a healthcare provider. People who live  with or visit you should also wear a facemask while they are in the same room with you.  Separate yourself from other people in your home As much as possible, you should stay in a different room from other people in your home. Also, you should use a separate bathroom, if available.  Avoid sharing household items You should not share dishes, drinking glasses, cups, eating utensils, towels, bedding, or other items with other people in your home. After using these items, you should wash them thoroughly with soap  and water.  Cover your coughs and sneezes Cover your mouth and nose with a tissue when you cough or sneeze, or you can cough or sneeze into your sleeve. Throw used tissues in a lined trash can, and immediately wash your hands with soap and water for at least 20 seconds or use an alcohol-based hand rub.  Wash your [employer] your hands often and thoroughly with soap and water for at least 20 seconds. You can use an alcohol-based hand sanitizer if soap and water are not available and if your hands are not visibly dirty. Avoid touching your eyes, nose, and mouth with unwashed hands.   Prevention Steps for Caregivers and Household Members of Individuals Confirmed to have, or Being Evaluated for, COVID-19 Infection Being Cared for in the Home  If you live with, or provide care at home for, a person confirmed to have, or being evaluated for, COVID-19 infection please follow these guidelines to prevent infection:  Follow healthcare providers instructions Make sure that you understand and can help the patient follow any healthcare provider instructions for all care.  Provide for the patients basic needs You should help the patient with basic needs in the home and provide support for getting groceries, prescriptions, and other personal needs.  Monitor the patients symptoms If they are getting sicker, call his or her medical provider and tell them that the patient has, or is being evaluated for, COVID-19 infection. This will help the healthcare providers office take steps to keep other people from getting infected. Ask the healthcare provider to call the local or state health department.  Limit the number of people who have contact with the patient  If possible, have only one caregiver for the patient.  Other household members should stay in another home or place of residence. If this is not possible, they should stay  in another room, or be separated from the patient as much as  possible. Use a separate bathroom, if available.  Restrict visitors who do not have an essential need to be in the home.  Keep older adults, very young children, and other sick people away from the patient Keep older adults, very young children, and those who have compromised immune systems or chronic health conditions away from the patient. This includes people with chronic heart, lung, or kidney conditions, diabetes, and cancer.  Ensure good ventilation Make sure that shared spaces in the home have good air flow, such as from an air conditioner or an opened window, weather permitting.  Wash your hands often  Wash your hands often and thoroughly with soap and water for at least 20 seconds. You can use an alcohol based hand sanitizer if soap and water are not available and if your hands are not visibly dirty.  Avoid touching your eyes, nose, and mouth with unwashed hands.  Use disposable paper towels to dry your hands. If not available, use dedicated cloth towels and replace them when they become wet.  Wear a facemask and gloves  Wear a disposable facemask at all times in the room and gloves when you touch or have contact with the patients blood, body fluids, and/or secretions or excretions, such as sweat, saliva, sputum, nasal mucus, vomit, urine, or feces.  Ensure the mask fits over your nose and mouth tightly, and do not touch it during use.  Throw out disposable facemasks and gloves after using them. Do not reuse.  Wash your hands immediately after removing your facemask and gloves.  If your personal clothing becomes contaminated, carefully remove clothing and launder. Wash your hands after handling contaminated clothing.  Place all used disposable facemasks, gloves, and other waste in a lined container before disposing them with other household waste.  Remove gloves and wash your hands immediately after handling these items.  Do not share dishes, glasses, or other household  items with the patient  Avoid sharing household items. You should not share dishes, drinking glasses, cups, eating utensils, towels, bedding, or other items with a patient who is confirmed to have, or being evaluated for, COVID-19 infection.  After the person uses these items, you should wash them thoroughly with soap and water.  Wash laundry thoroughly  Immediately remove and wash clothes or bedding that have blood, body fluids, and/or secretions or excretions, such as sweat, saliva, sputum, nasal mucus, vomit, urine, or feces, on them.  Wear gloves when handling laundry from the patient.  Read and follow directions on labels of laundry or clothing items and detergent. In general, wash and dry with the warmest temperatures recommended on the label.  Clean all areas the individual has used often  Clean all touchable surfaces, such as counters, tabletops, doorknobs, bathroom fixtures, toilets, phones, keyboards, tablets, and bedside tables, every day. Also, clean any surfaces that may have blood, body fluids, and/or secretions or excretions on them.  Wear gloves when cleaning surfaces the patient has come in contact with.  Use a diluted bleach solution (e.g., dilute bleach with 1 part bleach and 10 parts water) or a household disinfectant with a label that says EPA-registered for coronaviruses. To make a bleach solution at home, add 1 tablespoon of bleach to 1 quart (4 cups) of water. For a larger supply, add  cup of bleach to 1 gallon (16 cups) of water.  Read labels of cleaning products and follow recommendations provided on product labels. Labels contain instructions for safe and effective use of the cleaning product including precautions you should take when applying the product, such as wearing gloves or eye protection and making sure you have good ventilation during use of the product.  Remove gloves and wash hands immediately after cleaning.  Monitor yourself for signs and symptoms  of illness Caregivers and household members are considered close contacts, should monitor their health, and will be asked to limit movement outside of the home to the extent possible. Follow the monitoring steps for close contacts listed on the symptom monitoring form.   ? If you have additional questions, contact your local health department or call the epidemiologist on call at [phone] (available 24/7). ? This guidance is subject to change. For the most up-to-date guidance from CDC, please refer to their website: TripMetro.hu     Blood Glucose Monitoring, Adult Monitoring your blood sugar (glucose) is an important part of managing your diabetes (diabetes mellitus). Blood glucose monitoring involves checking your blood glucose as often as directed and keeping a record (log) of your results over time. Checking your blood glucose  regularly and keeping a blood glucose log can:  Help you and your health care provider adjust your diabetes management plan as needed, including your medicines or insulin.  Help you understand how food, exercise, illnesses, and medicines affect your blood glucose.  Let you know what your blood glucose is at any time. You can quickly find out if you have low blood glucose (hypoglycemia) or high blood glucose (hyperglycemia). Your health care provider will set individualized treatment goals for you. Your goals will be based on your age, other medical conditions you have, and how you respond to diabetes treatment. Generally, the goal of treatment is to maintain the following blood glucose levels:  Before meals (preprandial): 80-130 mg/dL (1.6-1.0 mmol/L).  After meals (postprandial): below 180 mg/dL (10 mmol/L).  A1c level: less than 7%. Supplies needed:  Blood glucose meter.  Test strips for your meter. Each meter has its own strips. You must use the strips that came with your meter.  A  needle to prick your finger (lancet). Do not use a lancet more than one time.  A device that holds the lancet (lancing device).  A journal or log book to write down your results. How to check your blood glucose  7. Wash your hands with soap and water. 8. Prick the side of your finger (not the tip) with the lancet. Use a different finger each time. 9. Gently rub the finger until a small drop of blood appears. 10. Follow instructions that come with your meter for inserting the test strip, applying blood to the strip, and using your blood glucose meter. 11. Write down your result and any notes. Some meters allow you to use areas of your body other than your finger (alternative sites) to test your blood. The most common alternative sites are:  Forearm.  Thigh.  Palm of the hand. If you think you may have hypoglycemia, or if you have a history of not knowing when your blood glucose is getting low (hypoglycemia unawareness), do not use alternative sites. Use your finger instead. Alternative sites may not be as accurate as the fingers, because blood flow is slower in these areas. This means that the result you get may be delayed, and it may be different from the result that you would get from your finger. Follow these instructions at home: Blood glucose log   Every time you check your blood glucose, write down your result. Also write down any notes about things that may be affecting your blood glucose, such as your diet and exercise for the day. This information can help you and your health care provider: ? Look for patterns in your blood glucose over time. ? Adjust your diabetes management plan as needed.  Check if your meter allows you to download your records to a computer. Most glucose meters store a record of glucose readings in the meter. If you have type 1 diabetes:  Check your blood glucose 2 or more times a day.  Also check your blood glucose: ? Before every insulin  injection. ? Before and after exercise. ? Before meals. ? 2 hours after a meal. ? Occasionally between 2:00 a.m. and 3:00 a.m., as directed. ? Before potentially dangerous tasks, like driving or using heavy machinery. ? At bedtime.  You may need to check your blood glucose more often, up to 6-10 times a day, if you: ? Use an insulin pump. ? Need multiple daily injections (MDI). ? Have diabetes that is not well-controlled. ? Are ill. ?  Have a history of severe hypoglycemia. ? Have hypoglycemia unawareness. If you have type 2 diabetes:  If you take insulin or other diabetes medicines, check your blood glucose 2 or more times a day.  If you are on intensive insulin therapy, check your blood glucose 4 or more times a day. Occasionally, you may also need to check between 2:00 a.m. and 3:00 a.m., as directed.  Also check your blood glucose: ? Before and after exercise. ? Before potentially dangerous tasks, like driving or using heavy machinery.  You may need to check your blood glucose more often if: ? Your medicine is being adjusted. ? Your diabetes is not well-controlled. ? You are ill. General tips  Always keep your supplies with you.  If you have questions or need help, all blood glucose meters have a 24-hour "hotline" phone number that you can call. You may also contact your health care provider.  After you use a few boxes of test strips, adjust (calibrate) your blood glucose meter by following instructions that came with your meter. Contact a health care provider if:  Your blood glucose is at or above 240 mg/dL (16.1 mmol/L) for 2 days in a row.  You have been sick or have had a fever for 2 days or longer, and you are not getting better.  You have any of the following problems for more than 6 hours: ? You cannot eat or drink. ? You have nausea or vomiting. ? You have diarrhea. Get help right away if:  Your blood glucose is lower than 54 mg/dL (3 mmol/L).  You become  confused or you have trouble thinking clearly.  You have difficulty breathing.  You have moderate or large ketone levels in your urine. Summary  Monitoring your blood sugar (glucose) is an important part of managing your diabetes (diabetes mellitus).  Blood glucose monitoring involves checking your blood glucose as often as directed and keeping a record (log) of your results over time.  Your health care provider will set individualized treatment goals for you. Your goals will be based on your age, other medical conditions you have, and how you respond to diabetes treatment.  Every time you check your blood glucose, write down your result. Also write down any notes about things that may be affecting your blood glucose, such as your diet and exercise for the day. This information is not intended to replace advice given to you by your health care provider. Make sure you discuss any questions you have with your health care provider. Document Released: 02/11/2003 Document Revised: 12/02/2017 Document Reviewed: 07/21/2015 Elsevier Patient Education  2020 Elsevier Inc.   Insulin Aspart injection [place] es este medicamento? La INSULINA ASPART es una insulina de origen [place]. Este medicamento disminuye la cantidad de [place]. Este medicamento es una insulina de accin rpida que comienza a actuar ms pronto que la insulina regular. La duracin es ms corta que la insulina regular. Este medicamento puede ser utilizado para otros usos; si tiene alguna pregunta consulte con su proveedor de atencin mdica o con su Social research officer, government. MARCAS COMUNES: Fiasp, Fiasp [employer], [employer], NovoLog, NovoLog Flexpen, NovoLog PenFill Qu le debo informar a mi profesional de la salud antes de tomar este medicamento? Necesitan saber si usted presenta alguno de los siguientes problemas o situaciones: episodios de bajo nivel de azcar en la sangre enfermedad ocular, problemas de la visin enfermedad  renal enfermedad heptica una reaccin alrgica o inusual a la insulina, al metacresol, a otros  medicamentos, alimentos, colorantes o conservantes si est embarazada o buscando quedar embarazada si est amamantando a un beb Cmo debo utilizar este medicamento? Este medicamento se administra mediante inyeccin por va subcutnea. selo exactamente como se le indique. Es importante seguir las instrucciones de su profesional de la salud o su mdico. Si est usando Novolog, debe comenzar su comida dentro de los 5 a 10 minutos despus de la inyeccin. Si est [employer], debe comenzar su comida al momento de la inyeccin o dentro de los 20 minutos despus de la inyeccin. Tenga comida lista antes de la inyeccin. No se demore en empezar a comer. Le ensearn a usar [employer] y a Manufacturing systems engineer dosis para actividades y [place]. No use ms insulina de la recetada. No use con mayor o menor frecuencia de la recetada. Siempre revise la apariencia de la insulina antes de usarla. Este medicamento debe ser transparente y sin color, como el [place]. No lo use si est turbio, espeso, con color o si tiene partculas slidas. Si [place] un Cabin crew, asegrese de Forensic scientist exterior de la aguja antes de usar la dosis. Es importante que deseche las agujas y las jeringas usadas en un recipiente resistente a los pinchazos. No las deseche en la basura. Si no tiene un recipiente resistente a los pinchazos, llame a su farmacutico o proveedor de atencin de la salud para obtenerlo. Hable con su pediatra para informarse acerca del uso de este medicamento en nios. Aunque este medicamento se puede recetar a nios tan pequeos como de 2 aos de edad con ciertas afecciones, existen precauciones que deben tomarse. Sobredosis: Pngase en contacto inmediatamente con un centro toxicolgico o una sala de urgencia si usted cree que haya tomado demasiado medicamento. ATENCIN: [employer] es solo para usted. No comparta este  medicamento con nadie. Qu sucede si me olvido de una dosis? Es importante de no olvidar una dosis. Su profesional de la salud o su mdico debe hablar con usted acerca de algn plan para dosis olvidadas. Si olvida una dosis, siga su plan. No use dosis dobles. Qu puede interactuar con este medicamento? otros medicamentos para la diabetes Muchos medicamentos pueden aumentar o disminuir el nivel de Dispensing optician. Estos incluyen: bebidas con alcohol medicamentos antivirales para el VIH o SIDA aspirina y medicamentos tipo aspirina ciertos medicamentos para la depresin, ansiedad o trastornos psicticos cromo diurticos hormonas femeninas, como estrgenos o progestinas, y pldoras anticonceptivas medicamentos para el corazn isoniazida IMAO, tales como [place], Eldepryl, Therapist, art, [place] y [place]arnate hormonas masculinas o esteroides anablicos medicamentos para bajar de peso medicamentos para alergias, asma, resfros o tos niacina AINE, medicamentos para el dolor y la inflamacin, como ibuprofeno o naproxeno octreotida pentamidina fenitona probenecida antibiticos del grupo de las quinolonas, tales como ciprofloxacino, levofloxacino y ofloxacino algunos suplementos dietticos a base de hierbas medicamentos esteroideos, como la prednisona o la cortisona sulfametoxasol; trimetoprima medicamento para la tiroides Algunos medicamentos pueden ocultar los sntomas de advertencia de niveles bajos de Dispensing optician. Es posible que deba monitorear ms atentamente su nivel de azcar en la sangre si est tomando uno de estos medicamentos. Estos medicamentos incluyen: betabloqueadores, tales como atenolol, metoprolol, propranolol clonidina guanetidina reserpina Puede ser que esta lista no menciona todas las posibles interacciones. Informe a su profesional de [employer]	[place] de [employer] Corporation productos a base de hierbas, medicamentos de [place] o suplementos nutritivos que est tomando. Si usted fuma, consume bebidas  alcohlicas o si utiliza drogas ilegales, indqueselo tambin a su  profesional de [employer]. Algunas sustancias pueden interactuar con su medicamento. A qu debo estar atento al usar PPL Corporation? Visite a su profesional de Be[employer] Homes o a su mdico para que revise regularmente su evolucin. Se monitorizar una prueba llamada Hemoglobina A1C (A1C). Es un anlisis de sangre sencillo. Mide su control del nivel de azcar en la sangre durante los ltimos 2 a 3 meses. Se le realizar esta prueba cada 3 a 6 meses. Aprenda a revisar su nivel de azcar en la sangre. Conozca los sntomas del nivel bajo y elevado de azcar en la sangre y cmo controlarlos. Siempre lleve con usted una fuente rpida de azcar por si tiene sntomas de nivel bajo de azcar KeyCorp. Algunos ejemplos incluyen caramelos duros de azcar o tabletas de glucosa. Asegrese de que otras personas sepan que usted se puede ahogar si come o bebe cuando presenta sntomas graves de [place] bajo de azcar en la sangre, como convulsiones o prdida del conocimiento. Deben obtener ayuda mdica de inmediato. Informe a su mdico o profesional de la salud si tiene niveles elevados de Banker. Es posible que deba cambiar la dosis de su medicamento. Si est enfermo o hace ms ejercicio que lo habitual, es posible que necesite cambiar la dosis de su medicamento. No saltee comidas. Pregunte a su mdico o profesional de la salud si debe evitar el alcohol. Muchos productos de venta libre para la tos y el resfro contienen azcar o alcohol. Estos pueden afectar los niveles de Banker. Asegrese de Omnicom tipo correcto de [place] para el tipo de insulina que [place]. Intente no [place] y tipo de [place] o [place], a menos que se lo indique su profesional de la salud o mdico. Multimedia programmer de [place] o tipo de insulina puede causar niveles peligrosamente elevados o bajos de Banker. Siempre tenga a mano un suministro extra  de insulina, jeringas y [place]. Use la jeringa solamente una vez. Deseche la Kyung Rudd y la aguja en un recipiente cerrado para evitar que alguien se pinche accidentalmente. Los inyectores y cartuchos de insulina nunca deben compartirse. Incluso si se cambia la aguja, al compartir se pueden contagiar virus como la hepatitis o el VIH. Cada vez que reciba una nueva caja de agujas de [place], verifique que sean del mismo tipo de las que est entrenado para Water engineer. Si no es as, pdale a Producer, television/film/video de la salud que le muestre cmo usar correctamente este nuevo tipo de [place]. Use un brazalete o una cadena de identificacin mdica, y lleve con usted una tarjeta que describa su enfermedad, detalles de su medicamento y horario de dosis. Qu efectos secundarios puedo tener al [employer] Scientific este medicamento? Efectos secundarios que debe informar a su mdico o a Producer, television/film/video de la salud tan pronto como sea posible: Therapist, art, como erupcin cutnea, picazn o urticarias, e hinchazn de la cara, los labios o la lengua problemas respiratorios signos y sntomas de niveles elevados de azcar en la sangre, tales como [place], boca seca, piel seca, aliento frutal, nuseas, dolor de [place], aumento del apetito o la sed, aumento de la frecuencia urinaria signos y sntomas de niveles bajos de International aid/development worker en la sangre, tales como ansiedad, confusin, [place], aumento del apetito, debilidad o cansancio inusuales, sudoracin, temblores, fro, irritabilidad, dolor de cabeza, visin borrosa, ritmo cardiaco rpido y prdida del conocimiento Efectos secundarios que generalmente no requieren atencin mdica (infrmelos a su mdico o a Producer, television/film/video de la  salud si persisten o si son molestos): aumento o disminucin del tejido adiposo debajo de la piel debido al uso excesivo de un lugar de inyeccin en particular picazn, ardor, hinchazn o erupcin en el lugar de la inyeccin Puede ser que esta lista no menciona todos los posibles  efectos secundarios. Comunquese a su mdico por asesoramiento mdico Hewlett[employer]ard. Usted puede informar los efectos secundarios a la FDA por telfono al 1-800-FDA-1088. Dnde debo guardar mi medicina? Mantenga fuera del alcance de los nios. Guarde los frascos de insulina sin abrir en el refrigerador a una temperatura de [place] 2 y 8 grados Celsius (36 y 28 grados Fahrenheit). No congele o utilice si la insulina ha [place]. Los frascos abiertos (frascos que est utilizando actualmente) pueden guardarse en el refrigerador o a Publishing rights manager, a aproximadamente 30 grados Celsius (86 grados Fahrenheit) o menos. Mantener su insulina a temperatura ambiente disminuye la cantidad de dolor que experimenta durante la inyeccin. Una vez abierta, la insulina se puede utilizar por [address] Despus de [address], deseche el frasco de [place]. Guarde los cartuchos, los FlexPens de [place], o los inyectores FlexTouch de [place] sin abrir en un refrigerador a una temperatura de [place] 2 y 8 grados Celsius (36 y 46 grados Fahrenheit). No los congele ni los utilice si la insulina ha [place]. Una vez abiertos, los cartuchos y los FlexPen de Novalog que estn insertados en los inyectores deben mantenerse a Marketing executive ambiente, aproximadamente a 25 grados Celsius (77 grados Fahrenheit) o menos por hasta [address] no los Set designer. El Hydrographic surveyor FlexTouch de [place] abierto puede guardarse a Marketing executive ambiente o refrigerado por hasta [address] Despus de [address] toda la insulina que no haya usado en cualquiera de esos productos abiertos deber desecharse. Proteja de la luz y del Market researcher. Deseche todo el medicamento sin usar despus de la fecha de vencimiento o despus de transcurrido el tiempo especfico de almacenamiento a Publishing rights manager. ATENCIN: Este folleto es un resumen. Puede ser que no cubra toda la posible informacin. Si usted tiene preguntas acerca de esta  medicina, consulte con su mdico, su farmacutico o su profesional de Radiographer, therapeutic.  2020 Elsevier/Gold Standard (2018-04-11 00:00:00)      COVID-19 COVID-19 La COVID-19 es una infeccin respiratoria causada por un virus llamado coronavirus tipo 2 causante del sndrome respiratorio agudo grave (SARS-CoV-2). La enfermedad tambin se conoce como enfermedad por coronavirus o nuevo coronavirus. En algunas personas, el virus puede no ocasionar sntomas. En otras, puede producir una infeccin grave. La infeccin puede empeorar rpidamente y causar complicaciones, como:  Neumona o infeccin en los pulmones.  Sndrome de dificultad respiratoria aguda o SDRA. Se trata de la acumulacin de lquido en los pulmones.  Insuficiencia respiratoria aguda. Se trata de una afeccin en la que no pasa suficiente oxgeno de los pulmones al cuerpo.  Sepsis o choque sptico. Se trata de una reaccin grave del cuerpo ante una infeccin.  Problemas de coagulacin.  Infecciones secundarias debido a bacterias u hongos. El virus que causa la COVID-19 es contagioso. Esto significa que puede transmitirse de [place] persona a otra a travs de las gotitas de saliva de la tos y de los estornudos (secreciones respiratorias). Cules son las causas? Esta enfermedad es causada por un virus. Usted puede contagiarse con este virus:  Al aspirar las gotitas que una persona infectada elimina al toser o Engineering geologist.  Al tocar algo, como una mesa o el picaportes de [place], [place]  estuvo expuesto al virus (contaminado) y luego tocarse la boca, nariz o los ojos. Qu incrementa el riesgo? Riesgo de infeccin Es ms probable que se infecte con este virus si:  Vive o viaja a una zona donde hay un brote de COVID-19.  Carollee Massed en contacto con una persona enferma que recientemente viaj a una zona con un brote de COVID-19.  Cuida o vive con una persona infectada con COVID-19. Riesgo de enfermedad grave Es ms probable que se enferme  gravemente por el virus si:  Tiene 65aos o ms.  Tiene una enfermedad crnica que disminuye la capacidad del cuerpo para combatir las infecciones (immunocomprometido).  Vive en un hogar de ancianos o centro de atencin a Air cabin crew.  Tiene una enfermedad prolongada (crnica), como las siguientes: ? Enfermedad pulmonar crnica, que incluye la enfermedad pulmonar obstructiva crnica o asma. ? Enfermedad cardaca. ? Diabetes. ? Enfermedad renal crnica. ? Enfermedad heptica.  Es obeso. Cules son los signos o sntomas? Los sntomas de esta afeccin pueden ser de leves a graves. Los sntomas pueden aparecer en el trmino de 2 a [address] despus de haber estado expuesto al virus. Incluyen los siguientes:  [place].  Tos.  Dificultad para respirar.  Escalofros.  Dolores musculares.  Dolor de Advertising copywriter.  Prdida del gusto o Cabin crew. Algunas personas tambin pueden Mattel, como nuseas, vmitos o diarrea. Es posible que otras personas no tengan sntomas de COVID-19. Cmo se diagnostica? Esta afeccin se puede diagnosticar en funcin de lo siguiente:  Sus signos y sntomas, especialmente si: ? Vive en una zona donde hay un brote de COVID-19. ? Viaj recientemente a una zona donde el virus es frecuente. ? Cuida o vive con Neomia Dear persona a quien se le diagnostic COVID-19.  Un examen fsico.  Anlisis de laboratorio que pueden incluir: ? Un hisopado nasal para tomar [place] de lquido de la nariz. ? Un hisopado de garganta para tomar Lauris Poag de lquido de la garganta. ? Una muestra de mucosidad de los pulmones (esputo). ? Anlisis de Ophiem.  Los estudios de diagnstico por imgenes pueden incluir radiografas, exploracin por tomografa computarizada (TC) o ecografa. Cmo se trata? En este momento, no hay ningn medicamento para tratar la COVID-19. Los medicamentos para tratar otras enfermedades se usan a modo de ensayo para comprobar si son  eficaces contra la COVID-19. El mdico le informar sobre las maneras de tratar los sntomas. En la [employer], la infeccin es leve y puede controlarse en el hogar con reposo, lquidos y medicamentos de [place]. El tratamiento para una infeccin grave suele realizarse en la unidad de cuidados intensivos (UCI) de un hospital. Puede incluir uno o ms de los siguientes. Estos tratamientos se administran hasta que los sntomas mejoran.  Recibir lquidos y United Parcel a travs de una va intravenosa.  Oxgeno complementario. Para administrar oxgeno extra, se [place] un tubo en la Darene Lamer, una mascarilla o una campana de oxgeno.  Colocarlo para que se recueste boca abajo (decbito prono). Esto facilita el ingreso de oxgeno a los pulmones.  Uso continuo de [place] de presin positiva de las vas areas (CPAP) o de presin positiva de las vas areas de dos niveles (BPAP). Este tratamiento utiliza una presin de aire leve para Pharmacologist las vas respiratorias abiertas. Un tubo conectado a un motor administra oxgeno al cuerpo.  Respirador. Este tratamiento mueve el aire dentro y fuera de los pulmones mediante el uso de un tubo que se coloca en la trquea.  Traqueostoma. En este procedimiento se hace un orificio en el cuello para insertar un tubo de respiracin.  Oxigenacin por membrana extracorprea (OMEC). En este procedimiento, los pulmones tienen la posibilidad de recuperarse al asumir las funciones del corazn y los pulmones. Suministra oxgeno al cuerpo y elimina el dixido de carbono. Siga estas instrucciones en su casa: Estilo de vida  Si est enfermo, qudese en su casa, excepto para obtener atencin mdica. El mdico le indicar cunto tiempo debe quedarse en casa. Llame al mdico antes de buscar atencin mdica.  Haga reposo en su casa como se lo haya indicado el mdico.  No consuma ningn producto que contenga nicotina o tabaco, como cigarrillos, cigarrillos  electrnicos y tabaco de Theatre managermascar. Si necesita ayuda para dejar de fumar, consulte al mdico.  Retome sus actividades normales como se lo haya indicado el mdico. Pregntele al mdico qu actividades son seguras para usted. Instrucciones generales  Use los medicamentos de venta libre y los recetados solamente como se lo haya indicado el mdico.  Beba suficiente lquido como para Pharmacologistmantener la orina de color amarillo plido.  Concurra a todas las visitas de [address]segu[address] como se lo haya indicado el mdico. Esto es importante. Cmo se evita?  No hay ninguna vacuna que ayude a prevenir la infeccin por la COVID-19. Sin embargo, hay medidas que puede tomar para protegerse y Conservator, museum/galleryproteger a Economistotras personas de este virus. Para protegerse:   No viaje a zonas donde la COVID-19 sea un riesgo. Las zonas donde se informa la presencia de la COVID-19 [place] con frecuencia. Para identificar las zonas de alto riesgo y las restricciones de viaje, consulte el sitio web de viajes de Building control surveyorlos Centers for Micron TechnologyDisease Control and Prevention Insurance claims handler(CDC) (Centros para el Control y la Prevencin de Event organisernfermedades): StageSync.siwwwnc.cdc.gov/travel/notices  Si vive o debe viajar a una zona donde COVID-19 es un riesgo, tome precauciones para evitar infecciones. ? Aljese de Engelhard Corporationlas personas enfermas. ? Lvese las manos frecuentemente con agua y [place]-Winfreejabn durante 20segundos. Use desinfectante para manos con alcohol si no dispone de [place] y [place]jabn. ? Evite tocarse la boca, la cara, los ojos o la [place]nariz. ? Evite salir de su casa, siga las indicaciones de su estado y de las autoridades sanitarias locales. ? Si debe salir de su casa, use un barbijo de tela o una mascarilla facial. ? Desinfecte los objetos y las superficies que se tocan con frecuencia todos Roverlos das. Pueden incluir:  Encimeras y Enumclawmesas.  Picaportes e interruptores de luz.  Lavabos, fregaderos y grifos.  Aparatos electrnicos tales como telfonos, controles remotos, teclados, computadoras y  tabletas. Cmo proteger a los dems: Si tiene sntomas de la COVID-19, tome medidas para evitar que el virus se propague a Economistotras personas.  Si cree que tiene una infeccin por la COVID-19, comunquese de inmediato con su mdico. Informe al equipo de atencin mdica que cree que puede tener una infeccin por la COVID-19.  Qudese en su casa. Salga de su casa solo para buscar atencin mdica. No utilice el transporte pblico.  No viaje mientras est enfermo.  Lvese las manos frecuentemente con agua y [place]jabn durante 20segundos. Usar desinfectante para manos con alcohol si no dispone de [place] y [place]jabn.  Mantngase alejado de quienes vivan con usted. Permita que los miembros de la familia sanos cuiden a los nios y las Wolverinemascotas, si es posible. Si tiene que cuidar a los nios o las mascotas, lvese las manos con frecuencia y use un barbijo. Si es posible, permanezca en su habitacin,  separado de los dems. Utilice un bao diferente.  Asegrese de que todas las personas que viven en su casa se laven bien las manos y con frecuencia.  Tosa o estornude en un pauelo de papel o sobre su manga o codo. No tosa o estornude al aire ni se cubra la boca o la nariz con la [place]mano.  Use un barbijo de tela o una mascarilla facial. Dnde buscar ms informacin  Centers for Disease Control and Prevention (Centros para el Control y la Prevencin de Event organisernfermedades): StickerEmporium.tnwww.cdc.gov/coronavirus/2019-ncov/index.html  World Health Organization (Organizacin Mundial de la Salud): https://thompson-craig.com/www.who.int/health-topics/coronavirus Comunquese con un mdico si:  Vive o ha viajado a una zona donde la COVID-19 es un riesgo y tiene sntomas de infeccin.  Ha tenido contacto con alguien que tiene COVID-19 y usted tiene sntomas de infeccin. Solicite ayuda de inmediato si:  Tiene dificultad para respirar.  Siente dolor u opresin en el pecho.  Experimenta confusin.  Tiene las uas de los dedos y los labios de color Dresbachazulado.  Tiene  dificultad para despertarse.  Los sntomas empeoran. Estos sntomas pueden representar un problema grave que constituye Radio broadcast assistantuna emergencia. No espere a ver si los sntomas desaparecen. Solicite atencin mdica de inmediato. Comunquese con el servicio de emergencias de su localidad (911 en los [place]). No conduzca por sus propios medios Dollar Generalhasta el hospital. Informe al personal mdico de emergencias si cree que tiene COVID-19. Resumen  La COVID-19 es una infeccin respiratoria causada por un virus. Tambin se conoce como enfermedad por coronavirus o nuevo coronavirus. Puede causar infecciones graves, como neumona, sndrome de dificultad respiratoria aguda, insuficiencia respiratoria aguda o sepsis.  El virus que causa la COVID-19 es contagioso. Esto significa que puede transmitirse de Burkina Fasouna persona a otra a travs de las gotitas de saliva de la tos y de los estornudos.  Es ms probable que desarrolle una enfermedad grave si tiene 65 aos o ms, tiene un sistema inmunitario dbil, vive en un hogar de ancianos o tiene enfermedad crnica.  No hay ningn medicamento para tratar la COVID-19. El mdico le informar sobre las maneras de tratar los sntomas.  Tome medidas para protegerse y Conservator, museum/galleryproteger a los Merchandiser, retaildems contra las infecciones. Lvese las manos con frecuencia y desinfecte los objetos y las superficies que se tocan con frecuencia todos [place]los das. Mantngase alejado de las personas que estn enfermas y use un barbijo si est enfermo. Esta informacin no tiene Theme park managercomo fin reemplazar el consejo del mdico. Asegrese de hacerle al mdico cualquier pregunta que tenga. Document Released: 04/08/2018 Document Revised: 07/11/2018 Document Reviewed: 04/08/2018 Elsevier Patient Education  2020 Elsevier Inc.   Prevencin de la propagacin de la COVID-19 si est enfermo Prevent the Spread of COVID-19 if You Are Sick Si tiene COVID-19 o piensa que podra tener esta enfermedad, siga los pasos a continuacin para  ayudar a Conservator, museum/galleryproteger a Economistotras personas de su hogar y su comunidad. Qudese en su casa, excepto para obtener atencin mdica.  Qudese en su casa. La Harley-Davidsonmayora de las personas con COVID-19 tienen una enfermedad leve y pueden recuperarse en su hogar sin atencin mdica. No salga de su casa, excepto para recibir atencin mdica. No visite lugares pblicos.  Cudese. Descanse y mantngase hidratado.  Solicite atencin mdica cuando sea necesario. Llame al mdico antes de ir a su consultorio para recibir atencin. No obstante, si tiene problemas para respirar u otros sntomas que le preocupen, llame al 911 para obtener ayuda inmediata.  Evite el transporte pblico, compartir vehculos o tomar taxis.  Seprese de Nucor Corporation y Warehouse manager.  En la mayor medida posible, permanezca en una habitacin especfica y alejado de otras personas y Warehouse manager. Adems, debe utilizar un bao aparte, si es posible. Si necesita estar cerca de otras personas o animales dentro o fuera de la casa, use un barbijo de tela. ? Si tiene [employer], consulte la seccin sobre la COVID-19 y los animales: https://www.stevens.org/- ncov/faq.html#COVID19animals Controle sus sntomas.  Los sntomas frecuentes de la COVID-19 incluyen fiebre y tos. La dificultad para respirar es un sntoma ms grave que significa que debe buscar atencin mdica.  Siga las instrucciones del mdico y del departamento de salud local. Las autoridades de salud locales le darn instrucciones para Chief Operating Officer sus sntomas y Neurosurgeon informacin. Si presenta signos de advertencia de emergencia relacionados con la COVID-19, solicite atencin mdica de inmediato.  Entre los signos de advertencia de Associate Professor, se incluyen los siguientes*:  Problemas respiratorios  Dolor u opresin persistente en el [employer] confusin o no poder ser despertado  Labios o rostro azulados *Esta lista no es [place].  Consulte al mdico si tiene otros sntomas que sean graves o le preocupen. Llame al 911 si tiene una emergencia mdica. Si tiene una emergencia mdica y Chief Strategy Officer al 911, notifique al operador que tiene o piensa que podra tener COVID-19. Si es posible, pngase un barbijo antes de que llegue la ayuda mdica. Llame con anticipacin antes de visitar al mdico.  Llame con anticipacin. Muchas visitas mdicas para la atencin de rutina se estn posponiendo o se realizan por telfono o telemedicina.  Si tiene una visita mdica que no se puede posponer, llame al [facility] del mdico. Esto ayudar a que quienes trabajan en el consultorio puedan protegerse y Conservator, museum/gallery a Sports administrator. Si est enfermo, use un barbijo de tela sobre la nariz y la boca.  Debe usar un barbijo de tela sobre la Clinical cytogeneticist y la boca si debe estar cerca de otras personas o animales, incluidas las mascotas (incluso en su casa).  No es necesario que use el barbijo de tela si est solo. Si no puede ponerse un barbijo de tela (debido a que tiene dificultad para respirar, por ejemplo), cbrase de algn otro modo cuando tosa o estornude. Trate de mantenerse al menos a una distancia de 6 pies (1.76m) de las Hydrographic surveyor. Esto ayudar a proteger a las personas que lo rodean. Nota: Durante la pandemia de COVID-19, los barbijos de grado mdico estn reservados para los trabajadores de la salud y para Environmental manager personas que brindan asistencia en casos de Sports administrator. Es posible que deba hacer su propio barbijo de tela con un pauelo o una bufanda. Cbrase al toser y Engineering geologist.  Cbrase la boca y la Darene Lamer con un pauelo descartable cuando tosa o estornude.  Arroje los pauelos descartables usados en un cesto de basura que tenga [place].  Inmediatamente, lvese las manos con agua y [place] durante al menos 20segundos. Si no dispone de agua y [place], [employer] las manos con un desinfectante de manos a base de alcohol que contenga al menos un  60% de alcohol. Lmpiese las manos con frecuencia.  Lvese las manos frecuentemente con agua y jabn durante al menos 20segundos. Esto es especialmente importante despus de sonarse la nariz, toser o estornudar; ir al bao; y antes de comer o preparar alimentos.  Utilice un desinfectante para manos si no dispone de [place] y [place]. Use un desinfectante para manos a base  de alcohol con al menos un 60% de alcohol, cubrindose todas las superficies de las manos y frotndoselas hasta que se sientan secas.  Usar agua y jabn es la mejor opcin, especialmente si las manos estn muy sucias.  No se toque los ojos, la nariz y la boca sin antes lavarse las manos. Evite compartir artculos domsticos personales.  No comparta platos, vasos, tazas, utensilios para comer, toallas ni ropa de cama con otras personas en su casa.  Despus de Boeing, lvelos bien con agua y jabn o pngalos en el lavavajillas. Limpie todos los das todas las superficies que se toquen con frecuencia.  Limpie y desinfecte las superficies que se toquen con frecuencia en su habitacin de enfermo y el bao. Deje que otra persona limpie y desinfecte las superficies de las zonas comunes, pero no su habitacin y bao.  Si un cuidador u Industrial/product designer y Paramedic habitacin o el bao de una persona enferma, debe hacerlo segn sea necesario. El cuidador o la otra persona deben usar un barbijo y Youth worker todo lo que sea posible despus de que la persona enferma haya usado el bao. Las superficies que se tocan con frecuencia incluyen telfonos, controles remotos, encimeras, mesadas, picaportes, artefactos del bao, inodoros, teclados, tabletas y [address] de [place].  Limpie y desinfecte las zonas que puedan tener [place], heces o lquidos corporales en su superficie.  Use limpiadores y desinfectantes domsticos. Limpie la zona o el elemento con agua y [place] o con otro detergente si est sucio. Luego, use un  desinfectante de uso domstico. ? No olvide seguir las instrucciones de la etiqueta para asegurarse de usar el producto de Wellsite geologist segura y Engineer, manufacturing. En el caso de muchos productos, se recomienda mantener la superficie hmeda durante algunos minutos para asegurarse de que se destruyan los grmenes. En muchos otros casos, tambin se recomiendan precauciones como el uso de guantes y asegurarse de Warehouse manager buena ventilacin durante el uso del producto. ? La Gwendolyn Fill de los desinfectantes domsticos registrados en Financial controller (EPA) (Agencia de Proteccin Ambiental) deberan ser eficaces. Cmo suspender el aislamiento en el hogar  Las personas con COVID-19 que han permanecido en el hogar (aislamiento en Advice worker) pueden dejar ese aislamiento bajo las siguientes condiciones: ? Si no se Civil engineer, contracting un anlisis para determinar si an Research officer, trade union, podr salir de su casa despus de que hayan ocurrido estas tres cosas:  No haya tenido Recruitment consultant al menos 72 horas (es Designer, jewellery, tres das enteros sin fiebre sin usar medicamentos para disminuir la Teacher, English as a foreign language) Y  otros sntomas hayan mejorado (por ejemplo, cuando la tos o la dificultad para respirar hayan mejorado) Y  hayan transcurrido al menos 10 das desde que tuvo los sntomas por primera vez. ? Si se le realizar un anlisis para determinar si an Research officer, trade union, podr salir de su casa despus de que hayan ocurrido estas tres cosas:  Ya no tenga fiebre (sin el uso de medicamentos para disminuir la Zinc) Y  otros sntomas hayan mejorado (por ejemplo, cuando la tos o la dificultad para respirar Investment banker, corporate) Y  haya recibido [employer] de anlisis negativos consecutivos con una diferencia de 24 horas. Su mdico seguir las Editor, commissioning for Control Disease and Prevention Insurance claims handler) (Centros para Air traffic controller y la Prevencin de [place]). En todos los [place], siga los consejos del mdico y del departamento de Psychiatrist. La  decisin de dejar el aislamiento en el hogar debe tomarse junto su  mdico y el departamento estatal y local de salud. Las decisiones locales dependen de las circunstancias locales. [email or web address] 04/27/2018 Esta informacin no tiene Theme park manager el consejo del mdico. Asegrese de hacerle al mdico cualquier pregunta que tenga. Document Released: 06/15/2018 Document Revised: 07/11/2018 Document Reviewed: 06/06/2018 Elsevier Patient Education  2020 [employer].

## 2018-09-15 NOTE — Discharge Summary (Signed)
PATIENT DETAILS Name: Ruth Fox Age: 43 y.o. Sex: female Date of Birth: [birth year] MRN: [id number]. Admitting Physician: Norval Morton, MD IDP:[id number], No Pcp Per  Admit Date: 09/11/2018 Discharge date: 09/15/2018  Recommendations for Outpatient Follow-up:  1. Follow up with PCP in 1-2 weeks 2. Please obtain BMP/CBC in one week 3. Please optimize diabetic regimen-if her sugars remain stable-can consider titrating her off insulin.  She was on insulin throughout the hospital stay-as she was also on steroids.  Admitted From:  Home  Disposition: [place]: No  Equipment/Devices: None  Discharge Condition: Stable  CODE STATUS: FULL CODE  Diet recommendation:   Diet Order            Diet Carb Modified        Diet heart healthy/carb modified Room service appropriate? Yes; Fluid consistency: Thin  Diet effective now               Brief Summary: See H&P, Labs, Consult and Test reports for all details in brief, Patient is a 43 y.o. female with PMHx of DM-2, bronchial asthma-presenting to the ED with acute hypoxemic respiratory failure secondary to COVID-19 pneumonia.   Brief Hospital Course: Acute Hypoxic Resp Failure due to Covid 19 Viral pneumonia: Significantly improved, at one point was on 5 L of oxygen-now on room air for the past 2 days.  Received 1 dose of Actemra on 7/21, subsequently continued to be treated with steroids and Remdesivir.  Will finish Remdesivir on 7/24-following which patient will be discharged on 5 more days of Decadron.    COVID-19 Labs:  Recent Labs    09/13/18 0210 09/14/18 0300 09/14/18 0400 09/15/18 0355  DDIMER 1.25* 1.28*  --  0.89*  FERRITIN 307  --  246 263  LDH 260* 244*  --  261*  CRP 4.6*  --  1.9* 1.0*    Lab Results  Component Value Date   SARSCOV2NAA POSITIVE (A) 09/11/2018      COVID-19 Medications: 7/20>> 7/24 Remdesivir 7/20>> Decadron 7/20>> Actemra  DM-2 with uncontrolled  hyperglycemia (A1c 7.2): Due to the fact that the patient was on steroids-had uncontrolled hyperglycemia required initiation of insulin.  Initially on Lantus-but due to the fact that patient has no insurance-she is being switched over to insulin 70/30 on discharge.  She apparently has not started taking metformin yet-this was prescribed to her by her outpatient MD before this hospitalization.  Suspect that her insulin requirements will start coming down once she is off steroids-hence have reduced insulin 70/30 to 12 units twice daily.  Patient was counseled to start metformin as well.  Patient was also instructed to keep a record of her CBGs-and take these readings to her next appointment with her PCP.  Insulin education-diabetic education was provided by the nursing staff as well as by this MD.  Symptoms of hypoglycemia-and necessary interventions if they occur was discussed as well.    Bronchial asthma: Stable-continue with as needed bronchodilators    Obesity: Estimated body mass index is 36.45 kg/m as calculated from the following:   Height as of this encounter: '5\' 2"'  (1.575 m).   Weight as of this encounter: 90.4 kg.    Procedures/Studies: None  Discharge Diagnoses:  Principal Problem:   Acute respiratory failure with hypoxia (HCC) Active Problems:   Pneumonia due to COVID-19 virus   SIRS (systemic inflammatory response syndrome) (HCC)   History of asthma   Hyperglycemia   Discharge Instructions:  Activity:  As  tolerated   Discharge Instructions    Call MD for:  difficulty breathing, headache or visual disturbances   Complete by: As directed    Call MD for:  persistant nausea and vomiting   Complete by: As directed    Call MD for:  temperature >100.4   Complete by: As directed    Diet Carb Modified   Complete by: As directed    Discharge instructions   Complete by: As directed    Follow with Primary MD in 1 week  Please check your sugars 3-4 times a day, keep a record  of these readings-take them to your next visit with your PCP  Please get a complete blood count and chemistry panel checked by your Primary MD at your next visit, and again as instructed by your Primary MD.  Get Medicines reviewed and adjusted: Please take all your medications with you for your next visit with your Primary MD  Laboratory/radiological data: Please request your Primary MD to go over all hospital tests and procedure/radiological results at the follow up, please ask your Primary MD to get all Hospital records sent to his/her office.  In some cases, they will be blood work, cultures and biopsy results pending at the time of your discharge. Please request that your primary care M.D. follows up on these results.  Also Note the following: If you experience worsening of your admission symptoms, develop shortness of breath, life threatening emergency, suicidal or homicidal thoughts you must seek medical attention immediately by calling 911 or calling your MD immediately  if symptoms less severe.  You must read complete instructions/literature along with all the possible adverse reactions/side effects for all the Medicines you take and that have been prescribed to you. Take any new Medicines after you have completely understood and accpet all the possible adverse reactions/side effects.   Do not drive when taking Pain medications or sleeping medications (Benzodaizepines)  Do not take more than prescribed Pain, Sleep and Anxiety Medications. It is not advisable to combine anxiety,sleep and pain medications without talking with your primary care practitioner  Special Instructions: If you have smoked or chewed Tobacco  in the last 2 yrs please stop smoking, stop any regular Alcohol  and or any Recreational drug use.  Wear Seat belts while driving.  Please note: You were cared for by a hospitalist during your hospital stay. Once you are discharged, your primary care physician will handle  any further medical issues. Please note that NO REFILLS for any discharge medications will be authorized once you are discharged, as it is imperative that you return to your primary care physician (or establish a relationship with a primary care physician if you do not have one) for your post hospital discharge needs so that they can reassess your need for medications and monitor your lab values.  ?   Person Under Monitoring Name: Lizmarie Witters  Location: [place] [address]   Infection Prevention Recommendations for Individuals Confirmed to have, or Being Evaluated for, 2019 Novel Coronavirus (COVID-19) Infection Who Receive Care at Home  Individuals who are confirmed to have, or are being evaluated for, COVID-19 should follow the prevention steps below until a healthcare provider or local or state health department says they can return to normal activities.  Stay home except to get medical care You should restrict activities outside your home, except for getting medical care. Do not go to work, school, or public areas, and do not use public transportation  or taxis.  Call ahead before visiting your doctor Before your medical appointment, call the healthcare provider and tell them that you have, or are being evaluated for, COVID-19 infection. This will help the healthcare provider's office take steps to keep other people from getting infected. Ask your healthcare provider to call the local or state health department.  Monitor your symptoms Seek prompt medical attention if your illness is worsening (e.g., difficulty breathing). Before going to your medical appointment, call the healthcare provider and tell them that you have, or are being evaluated for, COVID-19 infection. Ask your healthcare provider to call the local or state health department.  Wear a facemask You should wear a facemask that covers your nose and mouth when you are in the same room with other  people and when you visit a healthcare provider. People who live with or visit you should also wear a facemask while they are in the same room with you.  Separate yourself from other people in your home As much as possible, you should stay in a different room from other people in your home. Also, you should use a separate bathroom, if available.  Avoid sharing household items You should not share dishes, drinking glasses, cups, eating utensils, towels, bedding, or other items with other people in your home. After using these items, you should wash them thoroughly with soap and water.  Cover your coughs and sneezes Cover your mouth and nose with a tissue when you cough or sneeze, or you can cough or sneeze into your sleeve. Throw used tissues in a lined trash can, and immediately wash your hands with soap and water for at least 20 seconds or use an alcohol-based hand rub.  Wash your [facility] your hands often and thoroughly with soap and water for at least 20 seconds. You can use an alcohol-based hand sanitizer if soap and water are not available and if your hands are not visibly dirty. Avoid touching your eyes, nose, and mouth with unwashed hands.   Prevention Steps for Caregivers and Household Members of Individuals Confirmed to have, or Being Evaluated for, COVID-19 Infection Being Cared for in the Home  If you live with, or provide care at home for, a person confirmed to have, or being evaluated for, COVID-19 infection please follow these guidelines to prevent infection:  Follow healthcare provider's instructions Make sure that you understand and can help the patient follow any healthcare provider instructions for all care.  Provide for the patient's basic needs You should help the patient with basic needs in the home and provide support for getting groceries, prescriptions, and other personal needs.  Monitor the patient's symptoms If they are getting sicker, call his or her  medical provider and tell them that the patient has, or is being evaluated for, COVID-19 infection. This will help the healthcare provider's office take steps to keep other people from getting infected. Ask the healthcare provider to call the local or state health department.  Limit the number of people who have contact with the patient If possible, have only one caregiver for the patient. Other household members should stay in another home or place of residence. If this is not possible, they should stay in another room, or be separated from the patient as much as possible. Use a separate bathroom, if available. Restrict visitors who do not have an essential need to be in the home.  Keep older adults, very young children, and other sick people away from the patient Keep  older adults, very young children, and those who have compromised immune systems or chronic health conditions away from the patient. This includes people with chronic heart, lung, or kidney conditions, diabetes, and cancer.  Ensure good ventilation Make sure that shared spaces in the home have good air flow, such as from an air conditioner or an opened window, weather permitting.  Wash your hands often Wash your hands often and thoroughly with soap and water for at least 20 seconds. You can use an alcohol based hand sanitizer if soap and water are not available and if your hands are not visibly dirty. Avoid touching your eyes, nose, and mouth with unwashed hands. Use disposable paper towels to dry your hands. If not available, use dedicated cloth towels and replace them when they become wet.  Wear a facemask and gloves Wear a disposable facemask at all times in the room and gloves when you touch or have contact with the patient's blood, body fluids, and/or secretions or excretions, such as sweat, saliva, sputum, nasal mucus, vomit, urine, or feces.  Ensure the mask fits over your nose and mouth tightly, and do not touch it  during use. Throw out disposable facemasks and gloves after using them. Do not reuse. Wash your hands immediately after removing your facemask and gloves. If your personal clothing becomes contaminated, carefully remove clothing and launder. Wash your hands after handling contaminated clothing. Place all used disposable facemasks, gloves, and other waste in a lined container before disposing them with other household waste. Remove gloves and wash your hands immediately after handling these items.  Do not share dishes, glasses, or other household items with the patient Avoid sharing household items. You should not share dishes, drinking glasses, cups, eating utensils, towels, bedding, or other items with a patient who is confirmed to have, or being evaluated for, COVID-19 infection. After the person uses these items, you should wash them thoroughly with soap and water.  Wash laundry thoroughly Immediately remove and wash clothes or bedding that have blood, body fluids, and/or secretions or excretions, such as sweat, saliva, sputum, nasal mucus, vomit, urine, or feces, on them. Wear gloves when handling laundry from the patient. Read and follow directions on labels of laundry or clothing items and detergent. In general, wash and dry with the warmest temperatures recommended on the label.  Clean all areas the individual has used often Clean all touchable surfaces, such as counters, tabletops, doorknobs, bathroom fixtures, toilets, phones, keyboards, tablets, and bedside tables, every day. Also, clean any surfaces that may have blood, body fluids, and/or secretions or excretions on them. Wear gloves when cleaning surfaces the patient has come in contact with. Use a diluted bleach solution (e.g., dilute bleach with 1 part bleach and 10 parts water) or a household disinfectant with a label that says EPA-registered for coronaviruses. To make a bleach solution at home, add 1 tablespoon of bleach to 1  quart (4 cups) of water. For a larger supply, add  cup of bleach to 1 gallon (16 cups) of water. Read labels of cleaning products and follow recommendations provided on product labels. Labels contain instructions for safe and effective use of the cleaning product including precautions you should take when applying the product, such as wearing gloves or eye protection and making sure you have good ventilation during use of the product. Remove gloves and wash hands immediately after cleaning.  Monitor yourself for signs and symptoms of illness Caregivers and household members are considered close contacts, should monitor their  health, and will be asked to limit movement outside of the home to the extent possible. Follow the monitoring steps for close contacts listed on the symptom monitoring form.   ? If you have additional questions, contact your local health department or call the epidemiologist on call at [phone] (available 24/7). ? This guidance is subject to change. For the most up-to-date guidance from [facility], please refer to their website: YouBlogs.pl   Increase activity slowly   Complete by: As directed      Allergies as of 09/15/2018   No Known Allergies     Medication List    TAKE these medications   albuterol 108 (90 Base) MCG/ACT inhaler Commonly known as: VENTOLIN HFA Inhale 2 puffs into the lungs every 4 (four) hours.   benzonatate 100 MG capsule Commonly known as: Tessalon Perles Take 1 capsule (100 mg total) by mouth 3 (three) times daily as needed for cough.   blood glucose meter kit and supplies Dispense based on patient and insurance preference. Use up to four times daily as directed. (FOR ICD-10 E10.9, E11.9).   dexamethasone 6 MG tablet Commonly known as: DECADRON Take 1 tablet (6 mg total) by mouth daily for 5 days. Start taking on: September 16, 2018   insulin NPH-regular Human (70-30) 100  UNIT/ML injection Inject 12 Units into the skin 2 (two) times daily with a meal. Please provide 2 months supply of appropriate needles and syringes as well.   metFORMIN 500 MG tablet Commonly known as: GLUCOPHAGE Take 1 tablet (500 mg total) by mouth 2 (two) times daily.   montelukast 10 MG tablet Commonly known as: SINGULAIR Take 10 mg by mouth at bedtime.      Follow-up [place] Follow up on 10/10/2018.   Why: at 9:30am for your telephone follow-up appointment. Please be available for the call.  Contact information: [place] [address] [phone]         No Known Allergies  Consultations:   None   Other Procedures/Studies: Dg Chest Portable 1 View  Result Date: 09/11/2018 CLINICAL DATA:  Hypoxia.  Chest tightness.  History of asthma. EXAM: PORTABLE CHEST 1 VIEW COMPARISON:  None. FINDINGS: Mild patchy opacities in the bases, left greater than right. No pneumothorax. The cardiomediastinal silhouette is unremarkable. No other acute abnormalities. IMPRESSION: Mild patchy opacities in the bases, left greater than right, may represent developing pneumonia. Atypical infections should be considered. No other acute abnormalities are identified. Electronically Signed   By: Dorise Bullion III M.D   On: 09/11/2018 09:41      TODAY-DAY OF DISCHARGE:  Subjective:   Seerat Peaden today has no headache,no chest abdominal pain,no new weakness tingling or numbness, feels much better wants to go home today.   Objective:   Blood pressure 91/64, pulse 77, temperature 97.9 F (36.6 C), temperature source Oral, resp. rate (!) 26, height '5\' 2"'  (1.575 m), weight 90.4 kg, SpO2 95 %.  Intake/Output Summary (Last 24 hours) at 09/15/2018 1002 Last data filed at 09/14/2018 2200 Gross per 24 hour  Intake 490 ml  Output -  Net 490 ml   Filed Weights   09/11/18 2259 09/12/18 0434 09/13/18 0445  Weight:  91.3 kg 91.1 kg 90.4 kg    Exam: Awake Alert, Oriented *3, No new F.N deficits, Normal affect [place].AT,PERRAL Supple Neck,No JVD, No cervical lymphadenopathy appriciated.  Symmetrical Chest wall movement, Good air movement bilaterally, CTAB RRR,No Gallops,Rubs or  new Murmurs, No Parasternal Heave +ve B.Sounds, Abd Soft, Non tender, No organomegaly appriciated, No rebound -guarding or rigidity. No Cyanosis, Clubbing or edema, No new Rash or bruise   PERTINENT RADIOLOGIC STUDIES: Dg Chest Portable 1 View  Result Date: 09/11/2018 CLINICAL DATA:  Hypoxia.  Chest tightness.  History of asthma. EXAM: PORTABLE CHEST 1 VIEW COMPARISON:  None. FINDINGS: Mild patchy opacities in the bases, left greater than right. No pneumothorax. The cardiomediastinal silhouette is unremarkable. No other acute abnormalities. IMPRESSION: Mild patchy opacities in the bases, left greater than right, may represent developing pneumonia. Atypical infections should be considered. No other acute abnormalities are identified. Electronically Signed   By: Dorise Bullion III M.D   On: 09/11/2018 09:41     PERTINENT LAB RESULTS: CBC: Recent Labs    09/14/18 0300 09/15/18 0355  WBC 13.0* 13.1*  HGB 11.8* 12.4  HCT 35.6* 37.5  PLT 387 375   CMET CMP     Component Value Date/Time   NA 135 09/15/2018 0355   K 3.7 09/15/2018 0355   CL 99 09/15/2018 0355   CO2 26 09/15/2018 0355   GLUCOSE 119 (H) 09/15/2018 0355   BUN 16 09/15/2018 0355   CREATININE 0.50 09/15/2018 0355   CALCIUM 8.3 (L) 09/15/2018 0355   PROT 6.5 09/15/2018 0355   ALBUMIN 3.2 (L) 09/15/2018 0355   AST 55 (H) 09/15/2018 0355   ALT 73 (H) 09/15/2018 0355   ALKPHOS 42 09/15/2018 0355   BILITOT 0.4 09/15/2018 0355   GFRNONAA >60 09/15/2018 0355   GFRAA >60 09/15/2018 0355    GFR Estimated Creatinine Clearance: 93.8 mL/min (by C-G formula based on SCr of 0.5 mg/dL). No results for input(s): LIPASE, AMYLASE in the last 72 hours. No results for  input(s): CKTOTAL, CKMB, CKMBINDEX, TROPONINI in the last 72 hours. Invalid input(s): POCBNP Recent Labs    09/14/18 0300 09/15/18 0355  DDIMER 1.28* 0.89*   No results for input(s): HGBA1C in the last 72 hours. No results for input(s): CHOL, HDL, LDLCALC, TRIG, CHOLHDL, LDLDIRECT in the last 72 hours. No results for input(s): TSH, T4TOTAL, T3FREE, THYROIDAB in the last 72 hours.  Invalid input(s): FREET3 Recent Labs    09/14/18 0400 09/15/18 0355  FERRITIN 246 263   Coags: No results for input(s): INR in the last 72 hours.  Invalid input(s): PT Microbiology: Recent Results (from the past 240 hour(s))  SARS Coronavirus 2 (CEPHEID- Performed in [place] hospital lab), Hosp Order     Status: Abnormal   Collection Time: 09/11/18  9:16 AM   Specimen: Nasopharyngeal Swab  Result Value Ref Range Status   SARS Coronavirus 2 POSITIVE (A) NEGATIVE Final    Comment: RESULT CALLED TO, READ BACK BY AND VERIFIED WITH: DR. Regenia Skeeter, AT 1118 09/11/18 BY D. VANHOOK (NOTE) If result is NEGATIVE SARS-CoV-2 target nucleic acids are NOT DETECTED. The SARS-CoV-2 RNA is generally detectable in upper and lower  respiratory specimens during the acute phase of infection. The lowest  concentration of SARS-CoV-2 viral copies this assay can detect is 250  copies / mL. A negative result does not preclude SARS-CoV-2 infection  and should not be used as the sole basis for treatment or other  patient management decisions.  A negative result may occur with  improper specimen collection / handling, submission of specimen other  than nasopharyngeal swab, presence of viral mutation(s) within the  areas targeted by this assay, and inadequate number of viral copies  (<250 copies / mL). A negative  result must be combined with clinical  observations, patient history, and epidemiological information. If result is POSITIVE SARS-CoV-2 target nucleic acids are DETE CTED. The SARS-CoV-2 RNA is generally  detectable in upper and lower  respiratory specimens during the acute phase of infection.  Positive  results are indicative of active infection with SARS-CoV-2.  Clinical  correlation with patient history and other diagnostic information is  necessary to determine patient infection status.  Positive results do  not rule out bacterial infection or co-infection with other viruses. If result is PRESUMPTIVE POSTIVE SARS-CoV-2 nucleic acids MAY BE PRESENT.   A presumptive positive result was obtained on the submitted specimen  and confirmed on repeat testing.  While 2019 novel coronavirus  (SARS-CoV-2) nucleic acids may be present in the submitted sample  additional confirmatory testing may be necessary for epidemiological  and / or clinical management purposes  to differentiate between  SARS-CoV-2 and other Sarbecovirus currently known to infect humans.  If clinically indicated additional testing with an alternate test  methodology (LAB [phone]) is advised. The SARS-CoV-2 RNA is generally  detectable in upper and lower respiratory specimens during the acute  phase of infection. The expected result is Negative. Fact Sheet for Patients:  StrictlyIdeas.no Fact Sheet for Healthcare Providers: BankingDealers.co.za This test is not yet approved or cleared by the [place] FDA and has been authorized for detection and/or diagnosis of SARS-CoV-2 by FDA under an Emergency Use Authorization (EUA).  This EUA will remain in effect (meaning this test can be used) for the duration of the COVID-19 declaration under Section 564(b)(1) of the Act, 21 U.S.C. section 360bbb-3(b)(1), unless the authorization is terminated or revoked sooner. Performed at [facility], [place] [address], [place], [place] [address]   Culture, blood (routine x 2)     Status: None (Preliminary result)   Collection Time: 09/11/18  9:35 AM   Specimen: BLOOD  Result Value Ref  Range Status   Specimen Description BLOOD LEFT ANTECUBITAL  Final   Special Requests   Final    BOTTLES DRAWN AEROBIC AND ANAEROBIC Blood Culture adequate volume   Culture   Final    NO GROWTH 3 DAYS Performed at [facility], [place] [address], [place], [place] [address]    Report Status PENDING  Incomplete  Culture, blood (routine x 2)     Status: None (Preliminary result)   Collection Time: 09/11/18  9:48 AM   Specimen: BLOOD  Result Value Ref Range Status   Specimen Description BLOOD RIGHT ANTECUBITAL  Final   Special Requests   Final    BOTTLES DRAWN AEROBIC ONLY Blood Culture results may not be optimal due to an inadequate volume of blood received in culture bottles   Culture   Final    NO GROWTH 3 DAYS Performed at [facility], [place] [address], [place], [place] [address]    Report Status PENDING  Incomplete    FURTHER DISCHARGE INSTRUCTIONS:  Get Medicines reviewed and adjusted: Please take all your medications with you for your next visit with your Primary MD  Laboratory/radiological data: Please request your Primary MD to go over all hospital tests and procedure/radiological results at the follow up, please ask your Primary MD to get all Hospital records sent to his/her office.  In some cases, they will be blood work, cultures and biopsy results pending at the time of your discharge. Please request that your primary care M.D. goes through all the records of your hospital data  and follows up on these results.  Also Note the following: If you experience worsening of your admission symptoms, develop shortness of breath, life threatening emergency, suicidal or homicidal thoughts you must seek medical attention immediately by calling 911 or calling your MD immediately  if symptoms less severe.  You must read complete instructions/literature along with all the possible adverse reactions/side effects for all the Medicines you take and that have been prescribed to you.  Take any new Medicines after you have completely understood and accpet all the possible adverse reactions/side effects.   Do not drive when taking Pain medications or sleeping medications (Benzodaizepines)  Do not take more than prescribed Pain, Sleep and Anxiety Medications. It is not advisable to combine anxiety,sleep and pain medications without talking with your primary care practitioner  Special Instructions: If you have smoked or chewed Tobacco  in the last 2 yrs please stop smoking, stop any regular Alcohol  and or any Recreational drug use.  Wear Seat belts while driving.  Please note: You were cared for by a hospitalist during your hospital stay. Once you are discharged, your primary care physician will handle any further medical issues. Please note that NO REFILLS for any discharge medications will be authorized once you are discharged, as it is imperative that you return to your primary care physician (or establish a relationship with a primary care physician if you do not have one) for your post hospital discharge needs so that they can reassess your need for medications and monitor your lab values.  Total Time spent 35 minutes.  Signed: Shanker Ghimire 09/15/2018 10:02 AM

## 2018-09-15 NOTE — Progress Notes (Signed)
Extensive teaching done with patient in regards to Diabetic education. She was formerly on Metformin so she understands some basics already. She is also familiar with checking her blood sugars from previous experience checking when pregnant. She was able to demonstrate this knowledge by checking her own blood sugar today. We talked about diet and exercise. What to do if her blood sugar gets too low and how to treat it. We discussed different types of insulin and how to give injections and where. Pt. Has better understanding of Diabetes and how to are for self. She acknowledges and will utilize book and communicate with her PCP if further questions arise.

## 2018-09-16 LAB — CULTURE, BLOOD (ROUTINE X 2)
Culture: NO GROWTH
Culture: NO GROWTH
Special Requests: ADEQUATE

## 2018-10-09 NOTE — Progress Notes (Signed)
Patient ID: Ruth Fox, female   DOB: 1975/04/23, 43 y.o.   MRN: [id number]  THE PATIENT WAS A NOSHOW>  DID NOT ANSWER PHONE DESPITE MULTIPLE ATTEMPTS TO CALL

## 2018-10-10 ENCOUNTER — Ambulatory Visit: Payer: Self-pay | Attending: Critical Care Medicine | Admitting: Critical Care Medicine

## 2018-10-10 ENCOUNTER — Other Ambulatory Visit: Payer: Self-pay

## 2018-10-10 DIAGNOSIS — J1282 Pneumonia due to coronavirus disease 2019: Secondary | ICD-10-CM

## 2018-10-10 DIAGNOSIS — U071 COVID-19: Secondary | ICD-10-CM

## 2019-01-17 ENCOUNTER — Encounter: Payer: Self-pay | Admitting: Family Medicine

## 2019-01-17 ENCOUNTER — Ambulatory Visit (INDEPENDENT_AMBULATORY_CARE_PROVIDER_SITE_OTHER): Payer: Self-pay | Admitting: Family Medicine

## 2019-01-17 ENCOUNTER — Other Ambulatory Visit: Payer: Self-pay

## 2019-01-17 VITALS — BP 100/64 | HR 97 | Ht 59.96 in | Wt 206.4 lb

## 2019-01-17 DIAGNOSIS — Z23 Encounter for immunization: Secondary | ICD-10-CM

## 2019-01-17 DIAGNOSIS — Z0000 Encounter for general adult medical examination without abnormal findings: Secondary | ICD-10-CM

## 2019-01-17 DIAGNOSIS — E119 Type 2 diabetes mellitus without complications: Secondary | ICD-10-CM

## 2019-01-17 DIAGNOSIS — E1165 Type 2 diabetes mellitus with hyperglycemia: Secondary | ICD-10-CM

## 2019-01-17 DIAGNOSIS — Z8709 Personal history of other diseases of the respiratory system: Secondary | ICD-10-CM

## 2019-01-17 LAB — POCT GLYCOSYLATED HEMOGLOBIN (HGB A1C): HbA1c, POC (controlled diabetic range): 6.6 % (ref 0.0–7.0)

## 2019-01-17 NOTE — Progress Notes (Signed)
   Subjective:    Patient ID: Ruth Fox, female    DOB: 10-24-1975, 43 y.o.   MRN: [id number]   Virtual Spanish speaking interpretor used for this encounter  CC: [name] is a 43 yo female who presents today to establish care with new PCP   HPI:  Pt has no concerns today. I reviewed her general health since her hospital admission earlier this year with [place].   Diabetes Pt dx with Diabetes earlier this year. HbA1c 7.2 in July. Was started on Merformin prior to hospital admission in June/July but pt did not take. Pt was d/c from hospital with advice to take 70/30 Lantus for 3 weeks and then switch to Metformin. Pt unaware of this advice despite extensive documentation during inpatient stay. Pt denies blurred vision, polydipsia, polyuria or fatigue. Does not take blood glucose at home.   Asthma  During hospital admission in July for [place], pt given dx of Asthma. Pt denies hx of asthma or requiring bronchodilators in the past. Is doing well post discharge. Denies wheeze, SOB, cough, chest pain, dizziness or fevers. Feels she has 100% recovered.  Pap smear overdue >3 years ago   [facility] admission July 2020  No other hospital admissions   [place]  None  DH NKDA Nil   FH Mother had diabetes  SH  Works as Education administrator lady  Nil smoker or ETOH  No illicit drugs  No recent travel abroad  Past medical history, surgical, family, and social history reviewed and updated in the EMR as appropriate. Reviewed problem list.  Smoking status reviewed   ROS: pertinent noted in the HPI   Objective:  BP 100/64   Pulse 97   Ht 4' 11.96" (1.523 m)   Wt 206 lb 6 oz (93.6 kg)   SpO2 97%   BMI 40.36 kg/m   Vitals and nursing note reviewed  General: NAD, pleasant, able to participate in exam, increased body habitus  Cardiac: RRR, S1 S2 present. normal heart sounds, no murmurs. Respiratory: CTAB, normal effort, No wheezes, rales or rhonchi, no respiratory distress  Extremities: no edema or cyanosis. Skin: warm and dry, no rashes noted Neuro: alert, no obvious focal deficits Psych: Normal affect and mood   Assessment & Plan:    Diabetes mellitus without complication (HCC) WHQ7R 6.6 today without Metformin use. Diabetes well controlled. -Provided dietary recommendations to pt -HbA1c in 3 months time, can consider restarting Metformin for not at goal.   Morbid obesity ([place]) Provided lifestyle recommedations for weight loss i.e. diet and exercise. Will review pt's progress at next clinic visit.  Pt will require pap smear at next clinic visit.  Lattie Haw, MD  [facility] PGY-1

## 2019-01-17 NOTE — Patient Instructions (Signed)
Diabetes mellitus y nutricin, en adultos Diabetes Mellitus and Nutrition, Adult Si sufre de diabetes (diabetes mellitus), es muy importante tener hbitos alimenticios saludables debido a que sus niveles de Psychologist, counsellingazcar en la sangre (glucosa) se ven afectados en gran medida por lo que come y bebe. [place] alimentos saludables en las cantidades [place]ansom Canyonadecuadas, aproximadamente a la Smith Internationalmisma hora todos los das, [place]lo ayudar a:  Scientist, physiologicalControlar la glucemia.  Disminuir el riesgo de sufrir una enfermedad cardaca.  Mejorar la presin arterial.  BaristaAlcanzar o mantener un peso saludable. Todas las personas que sufren de diabetes son diferentes y cada una tiene necesidades diferentes en cuanto a un plan de alimentacin. El mdico puede recomendarle que trabaje con un especialista en dietas y nutricin (nutricionista) para Tax adviserelaborar el mejor plan para usted. Su plan de alimentacin puede variar segn factores como:  Las caloras que necesita.  Los medicamentos que toma.  Su peso.  Sus niveles de glucemia, presin arterial y colesterol.  Su nivel de [place] Grenadinesactividad.  Otras afecciones que tenga, como enfermedades cardacas o renales. Cmo me afectan los carbohidratos? Los carbohidratos, o hidratos de carbono, afectan su nivel de glucemia ms que cualquier otro tipo de alimento. La ingesta de carbohidratos naturalmente aumenta la cantidad de CarMaxglucosa en la sangre. El recuento de carbohidratos es un mtodo destinado a Midwifellevar un registro de la cantidad de carbohidratos que se consumen. El recuento de carbohidratos es importante para Pharmacologistmantener la glucemia a un nivel saludable, especialmente si utiliza insulina o toma determinados medicamentos por va oral para la diabetes. Es importante conocer la cantidad de carbohidratos que se pueden ingerir en cada comida sin correr Surveyor, mineralsningn riesgo. Esto es Government social research officerdiferente en cada persona. Su nutricionista puede ayudarlo a calcular la cantidad de carbohidratos que debe ingerir en cada comida y en cada  refrigerio. Entre los alimentos que contienen carbohidratos, se incluyen:  Pan, cereal, arroz, pastas y galletas.  Papas y maz.  Guisantes, frijoles y lentejas.  Leche y Dentistyogur.  Nils PyleFrutas y Sloveniajugo.  Postres, como pasteles, galletas, helado y caramelos. Cmo me afecta el alcohol? El alcohol puede provocar disminuciones sbitas de la glucemia (hipoglucemia), especialmente si utiliza insulina o toma determinados medicamentos por va oral para la diabetes. La hipoglucemia es una afeccin potencialmente mortal. Los sntomas de la hipoglucemia (somnolencia, mareos y confusin) son similares a los sntomas de haber consumido demasiado alcohol. Si el mdico afirma que el alcohol es seguro para usted, Main[place] estas pautas:  Limite el consumo de alcohol a no ms de 1medida por da si es mujer y no est Lewisvilleembarazada, y a 2medidas si es hombre. Una medida equivale a 12oz (355ml) de cerveza, 5oz (148ml) de vino o 1oz (44ml) de bebidas alcohlicas de alta graduacin.  No beba con el estmago vaco.  Mantngase hidratado bebiendo agua, refrescos dietticos o t helado sin azcar.  Tenga en cuenta que los refrescos comunes, los jugos y otras bebida para Engineer, manufacturingmezclar pueden contener mucha azcar y se deben contar como carbohidratos. Cules son algunos consejos para seguir este plan?  Leer las etiquetas de los alimentos  Comience por leer el tamao de la porcin en la "Informacin nutricional" en las etiquetas de los alimentos envasados y las bebidas. La cantidad de caloras, carbohidratos, grasas y otros nutrientes mencionados en la etiqueta se basan en una porcin del alimento. Muchos alimentos contienen ms de una porcin por envase.  Verifique la cantidad total de gramos (g) de carbohidratos totales en una porcin. Puede calcular la cantidad de porciones de carbohidratos al dividir el  Muchos alimentos contienen ms de una porcin por envase.   Verifique la cantidad total de gramos (g) de carbohidratos totales en una porcin. Puede calcular la cantidad de porciones de carbohidratos al dividir el total de carbohidratos por 15. Por ejemplo, si un alimento tiene un total de 30g de carbohidratos, equivale a 2  porciones de carbohidratos.   Verifique la cantidad de gramos (g) de grasas saturadas y grasas trans en una porcin. Escoja alimentos que no contengan grasa o que tengan un bajo contenido.   Verifique la cantidad de miligramos (mg) de sal (sodio) en una porcin. La mayora de las personas deben limitar la ingesta de sodio total a menos de 2300mg por da.   Siempre consulte la informacin nutricional de los alimentos etiquetados como "con bajo contenido de grasa" o "sin grasa". Estos alimentos pueden tener un mayor contenido de azcar agregada o carbohidratos refinados, y deben evitarse.   Hable con su nutricionista para identificar sus objetivos diarios en cuanto a los nutrientes mencionados en la etiqueta.  Al ir de compras   Evite comprar alimentos procesados, enlatados o precocinados. Estos alimentos tienden a tener una mayor cantidad de grasa, sodio y azcar agregada.   Compre en la zona exterior de la tienda de comestibles. Esta zona incluye frutas y verduras frescas, granos a granel, carnes frescas y productos lcteos frescos.  Al cocinar   Utilice mtodos de coccin a baja temperatura, como hornear, en lugar de mtodos de coccin a alta temperatura, como frer en abundante aceite.   Cocine con aceites saludables, como el aceite de oliva, canola o girasol.   Evite cocinar con manteca, crema o carnes con alto contenido de grasa.  Planificacin de las comidas   Coma las comidas y los refrigerios regularmente, preferentemente a la misma hora todos los das. Evite pasar largos perodos de tiempo sin comer.   Consuma alimentos ricos en fibra, como frutas frescas, verduras, frijoles y cereales integrales. Consulte a su nutricionista sobre cuntas porciones de carbohidratos puede consumir en cada comida.   Consuma entre 4 y 6 onzas (oz) de protenas magras por da, como carnes magras, pollo, pescado, huevos o tofu. Una onza de protena magra equivale a:  ? 1 onza de carne, pollo o  pescado.  ? 1huevo.  ?  taza de tofu.   Coma algunos alimentos por da que contengan grasas saludables, como aguacates, frutos secos, semillas y pescado.  Estilo de vida   Controle su nivel de glucemia con regularidad.   Haga actividad fsica habitualmente como se lo haya indicado el mdico. Esto puede incluir lo siguiente:  ? 150minutos semanales de ejercicio de intensidad moderada o alta. Esto podra incluir caminatas dinmicas, ciclismo o gimnasia acutica.  ? Realizar ejercicios de elongacin y de fortalecimiento, como yoga o levantamiento de pesas, por lo menos 2veces por semana.   Tome los medicamentos como se lo haya indicado el mdico.   No consuma ningn producto que contenga nicotina o tabaco, como cigarrillos y cigarrillos electrnicos. Si necesita ayuda para dejar de fumar, consulte al mdico.   Trabaje con un asesor o instructor en diabetes para identificar estrategias para controlar el estrs y cualquier desafo emocional y social.  Preguntas para hacerle al mdico   Es necesario que consulte a un instructor en el cuidado de la diabetes?   Es necesario que me rena con un nutricionista?   A qu nmero puedo llamar si tengo preguntas?   Cules son los mejores momentos para controlar la glucemia?  Dnde   Trabajar con un especialista en dietas y nutricin (nutricionista) puede ayudarlo a Designer, television/film set de alimentacin para usted.  Tenga en cuenta que los carbohidratos (hidratos de carbono) y el alcohol tienen  efectos inmediatos en sus niveles de glucemia. Es importante contar los carbohidratos que ingiere y consumir alcohol con prudencia. Esta informacin no tiene Theme park manager el consejo del mdico. Asegrese de hacerle al mdico cualquier pregunta que tenga. Document Released: 05/18/2007 Document Revised: 10/19/2016 Document Reviewed: 05/31/2016 Elsevier Patient Education  2020 [employer].     Diabetes mellitus y actividad fsica Diabetes Mellitus and Exercise Hacer actividad fsica habitualmente es importante para el estado de salud general, en especial si tiene diabetes (diabetes mellitus). La actividad fsica no solo se reduce a Psychiatric nurse. Aporta muchos beneficios para la salud, como aumento de la fuerza muscular y la densidad sea, y reduccin de las grasas corporales y Development worker, community. Esto mejora el estado fsico, la flexibilidad y la resistencia, y todo ello redunda en un mejor estado de salud general. La actividad fsica tiene beneficios adicionales para los diabticos, entre ellos:  Disminuye el apetito.  Ayuda a bajar y Photographer glucemia bajo control.  [place]aja la presin arterial.  Ayuda a controlar las cantidades de sustancias grasas (lpidos) en la [place], como el colesterol y los triglicridos.  Mejora la respuesta del cuerpo a la insulina (optimizacin de la sensibilidad a la insulina).  Reduce la cantidad de insulina que el cuerpo necesita.  Reduce el riesgo de sufrir cardiopata coronaria de la siguiente forma: ? [employer] de colesterol y triglicridos. ? Aumenta los niveles de colesterol bueno. ? Disminuye la glucemia. Cul es mi plan de Doroteo Glassman? El mdico o un educador para la diabetes certificado pueden ayudarlo a Teacher, English as a foreign language del tipo y de la frecuencia de actividad fsica (plan de actividades) adecuado para usted. Asegrese de lo siguiente:  Haga por lo menos semanales de ejercicios de intensidad moderada o vigorosa. Estos  podran ser caminatas dinmicas, ciclismo o [place]. ? Haga ejercicios de elongacin y de fortalecimiento, como yoga o levantamiento de pesas, por lo menos 2veces por semana. ? Reparta la actividad en al menos 3das de la semana.  Haga algn tipo de actividad fsica CarMax. ? No deje pasar ms de 2das seguidos sin hacer algn tipo de actividad fsica. ? Evite permanecer inactivo durante ms de seguidos. Tmese descansos frecuentes para caminar o estirarse.  Elija un tipo de ejercicio o de actividad que disfrute y establezca objetivos realistas.  Comience lentamente y aumente de [place] gradual la intensidad del ejercicio con el correr del [place]. Qu debo saber acerca del control de la diabetes?   Contrlese la glucemia antes y despus de ejercitarse. ? Si la glucemia es de 240mg /dl ( ) o ms antes de comenzar a hacer actividad fsica, controle la orina para detectar la presencia de cetonas. Si tiene 71,6RCVE/L orina, no haga ejercicio hasta que la glucemia se normalice. ? Si la glucemia es de 100 mg/dl (5.6 mmol/l) o menos, tome una colacin que Federal[employer]og[employer] 15 y 20 gramos de carbohidratos. Controle la glucemia 15 minutos despus de la colacin para asegurarse de que el nivel est por encima de 100 mg/dl (5.6 mmol/l) antes de comenzar a hacer actividad fsica.  Conozca los sntomas de la glucemia baja (hipoglucemia) y aprenda cmo tratarla. El riesgo de tener hipoglucemia DIRECTV durante y despus de hacer actividad fsica. Los sntomas frecuentes de hipoglucemia pueden  incluir los siguientes: ? Hambre. ? Ansiedad. ? Sudoracin y Intel Corporation. ? Confusin. ? Mareos o sensacin de desvanecimiento. ? Aumento de la frecuencia cardaca o palpitaciones. ? Visin borrosa. ? Hormigueo o adormecimiento alrededor Exxon Mobil Corporation, los labios o la [place]. ? Estremecimientos y temblores. ? Irritabilidad.  Tenga una colacin de carbohidratos de accin  rpida disponible antes, durante y despus de ejercitarse, a fin de evitar o tratar la hipoglucemia.  Evite inyectarse insulina en las zonas del cuerpo que ejercitar. Por ejemplo, evite inyectarse insulina en: ? Los brazos, si juega al tenis. ? Las piernas, si corre.  Lleve registros de sus hbitos de actividad fsica. Esto puede ayudarlos a usted y al mdico a Tax adviser de control de la diabetes segn sea necesario. Escriba los siguientes datos: ? Los alimentos que consume antes y despus de Field seismologist actividad fsica. ? Los niveles de glucosa en la sangre antes y despus de hacer [place]. ? El tipo y cantidad de [place] fsica que Musician. ? Cuando se prev que la insulina alcance su valor mximo, si [place] insulina. No haga actividad fsica en los momentos en que insulina alcanza su valor mximo.  Cuando comience un ejercicio o una actividad nuevos, trabaje con el mdico para asegurarse de que la actividad sea segura para usted y para Secretary/administrator la [place], los medicamentos o la ingesta de alimentos segn sea necesario.  Beba gran cantidad de agua mientras hace ejercicio para evitar la deshidratacin o los golpes de Freight forwarder. Beba suficiente lquido como para mantener la orina clara o de color amarillo plido. Resumen  Hacer actividad fsica habitualmente es importante para el estado de salud general, en especial si tiene diabetes (diabetes mellitus).  La actividad fsica aporta muchos beneficios para la salud, como aumentar la fuerza muscular y la densidad sea, y reducir las grasas corporales y Dealer.  El mdico o un educador para la diabetes certificado pueden ayudarlo a Engineer, petroleum del tipo y de la frecuencia de actividad fsica (plan de actividades) adecuado para usted.  Cuando comience un ejercicio o una actividad nuevos, trabaje con el mdico para asegurarse de que la actividad sea segura para usted y para Secretary/administrator la [place], los medicamentos o la ingesta de  alimentos segn sea necesario. Esta informacin no tiene Marine scientist el consejo del mdico. Asegrese de hacerle al mdico cualquier pregunta que tenga. Document Released: 02/28/2007 Document Revised: 12/06/2016 Document Reviewed: 07/21/2015 Elsevier Patient Education  2020 [employer].

## 2019-01-18 LAB — CBC WITH DIFFERENTIAL/PLATELET
Basophils Absolute: 0 10*3/uL (ref 0.0–0.2)
Basos: 0 %
EOS (ABSOLUTE): 0.1 10*3/uL (ref 0.0–0.4)
Eos: 1 %
Hematocrit: 40.4 % (ref 34.0–46.6)
Hemoglobin: 13.5 g/dL (ref 11.1–15.9)
Immature Grans (Abs): 0 10*3/uL (ref 0.0–0.1)
Immature Granulocytes: 0 %
Lymphocytes Absolute: 2.4 10*3/uL (ref 0.7–3.1)
Lymphs: 28 %
MCH: 29.5 pg (ref 26.6–33.0)
MCHC: 33.4 g/dL (ref 31.5–35.7)
MCV: 88 fL (ref 79–97)
Monocytes Absolute: 0.5 10*3/uL (ref 0.1–0.9)
Monocytes: 6 %
Neutrophils Absolute: 5.7 10*3/uL (ref 1.4–7.0)
Neutrophils: 65 %
Platelets: 280 10*3/uL (ref 150–450)
RBC: 4.57 x10E6/uL (ref 3.77–5.28)
RDW: 12.8 % (ref 11.7–15.4)
WBC: 8.8 10*3/uL (ref 3.4–10.8)

## 2019-01-18 LAB — BASIC METABOLIC PANEL
BUN/Creatinine Ratio: 20 (ref 9–23)
BUN: 12 mg/dL (ref 6–24)
CO2: 20 mmol/L (ref 20–29)
Calcium: 9.4 mg/dL (ref 8.7–10.2)
Chloride: 102 mmol/L (ref 96–106)
Creatinine, Ser: 0.6 mg/dL (ref 0.57–1.00)
GFR calc Af Amer: 128 mL/min/{1.73_m2} (ref >59)
GFR calc non Af Amer: 111 mL/min/{1.73_m2} (ref >59)
Glucose: 110 mg/dL — ABNORMAL HIGH (ref 65–99)
Potassium: 4.1 mmol/L (ref 3.5–5.2)
Sodium: 141 mmol/L (ref 134–144)

## 2019-01-20 DIAGNOSIS — E119 Type 2 diabetes mellitus without complications: Secondary | ICD-10-CM | POA: Insufficient documentation

## 2019-01-20 NOTE — Assessment & Plan Note (Signed)
Provided lifestyle recommedations for weight loss i.e. diet and exercise. Will review pt's progress at next clinic visit.

## 2019-01-20 NOTE — Assessment & Plan Note (Signed)
HbA1c 6.6 today without Metformin use. Diabetes well controlled. -Provided dietary recommendations to pt -HbA1c in 3 months time, can consider restarting Metformin for not at goal.

## 2019-01-25 ENCOUNTER — Encounter: Payer: Self-pay | Admitting: Family Medicine

## 2019-03-21 ENCOUNTER — Other Ambulatory Visit: Payer: Self-pay

## 2019-03-22 ENCOUNTER — Ambulatory Visit (INDEPENDENT_AMBULATORY_CARE_PROVIDER_SITE_OTHER): Payer: Self-pay | Admitting: Obstetrics and Gynecology

## 2019-03-22 ENCOUNTER — Encounter: Payer: Self-pay | Admitting: Obstetrics and Gynecology

## 2019-03-22 VITALS — BP 124/82 | Ht 60.0 in | Wt 208.0 lb

## 2019-03-22 DIAGNOSIS — Z30432 Encounter for removal of intrauterine contraceptive device: Secondary | ICD-10-CM

## 2019-03-22 MED ORDER — NORGESTIMATE-ETH ESTRADIOL 0.25-35 MG-MCG PO TABS: 1.0000 | ORAL_TABLET | Freq: Every day | ORAL | 3 refills | Status: AC

## 2019-03-22 NOTE — Patient Instructions (Signed)
Please let us know if you have any concerns or problems with the birth control pills.  Use backup contraception for 7 days after starting the birth control pill.  You are welcome to return to the office if and when you want to have another IUD placed.

## 2019-03-22 NOTE — Progress Notes (Signed)
   Ruth Fox  1975/10/28 [id number]  HPI The patient is a 44 y.o. G2P2 who presents today for an IUD removal.  She has had a Mirena IUD in place for about 5 years and would like to remove and potentially replace it.  She did see a nurse practitioner in another department recently and IUD strings were not visible per their documentation.  A professional Spanish interpreter is present for the visit today.  Past medical history,surgical history, problem list, medications, allergies, family history and social history were all reviewed and documented as reviewed in the EPIC chart.  ROS:  GYN ROS: amenorrheic on IUD, no abnormal bleeding, pelvic pain or discharge.  Physical Exam  BP 124/82   Ht 5' (1.524 m)   Wt 208 lb (94.3 kg)   BMI 40.62 kg/m   General: Pleasant female, no acute distress, alert and oriented PELVIC EXAM: VULVA: normal appearing vulva with no masses, tenderness or lesions, VAGINA: normal appearing vagina with normal color and discharge, no lesions, CERVIX: normal appearing cervix without discharge or lesions.  IUD strings not visible.  Procedure IUD removal With the cervix induced with a speculum, a Cytobrush was used to gently probe the endocervical canal, which was successful in retrieving the IUD strings which are grasped with a [place] forceps and the IUD was removed in its entirety without difficulty.  The device was viewed by patient and staff to confirm that it was removed intact.  The patient tolerated the procedure well.  Kennon Portela present for the exam and procedure   Assessment 44 y.o. G2P2 presenting for IUD removal  Plan The patient was self-pay without insurance.  She presented mainly to have her IUD removed as it was approaching or after 5 years.  I did let her know that the Mirena IUD is likely quite effective for contraception even up to 6 to 7 years.  Therefore, she does not necessarily need to remove it right now.  She did want to have another  Mirena IUD placed, but when quoted the out-of-pocket cost, she was not financially able to do this today.  We then discussed other contraceptive options and ultimately decided on oral contraceptive pills, which she has taken in the past.  I recommended that she go ahead and start this right away and to be safe she can use a 7-day backup contraceptive method.  We discussed some the common side effects with birth control pills.  We ensured that she does not have any medical contraindication and she denies any smoking, migraine headaches with aura, liver cysts or severe disease, thromboembolic disease history, or significantly uncontrolled hypertension/diabetes (improvement in hgbA1c noted).  When she wants to have an IUD placed again, she is welcome to return to the office at that time or for any other gynecologic concerns.  All questions were answered by the end of the visit.   Ilda Foil MD 03/22/19

## 2019-12-26 IMAGING — DX PORTABLE CHEST - 1 VIEW
1 series · 1 of 1 positions shown · non-contrast
Comparison: None.

CLINICAL DATA: Hypoxia.  Chest tightness.  History of asthma.

EXAM:
PORTABLE CHEST 1 VIEW

[chest ap]
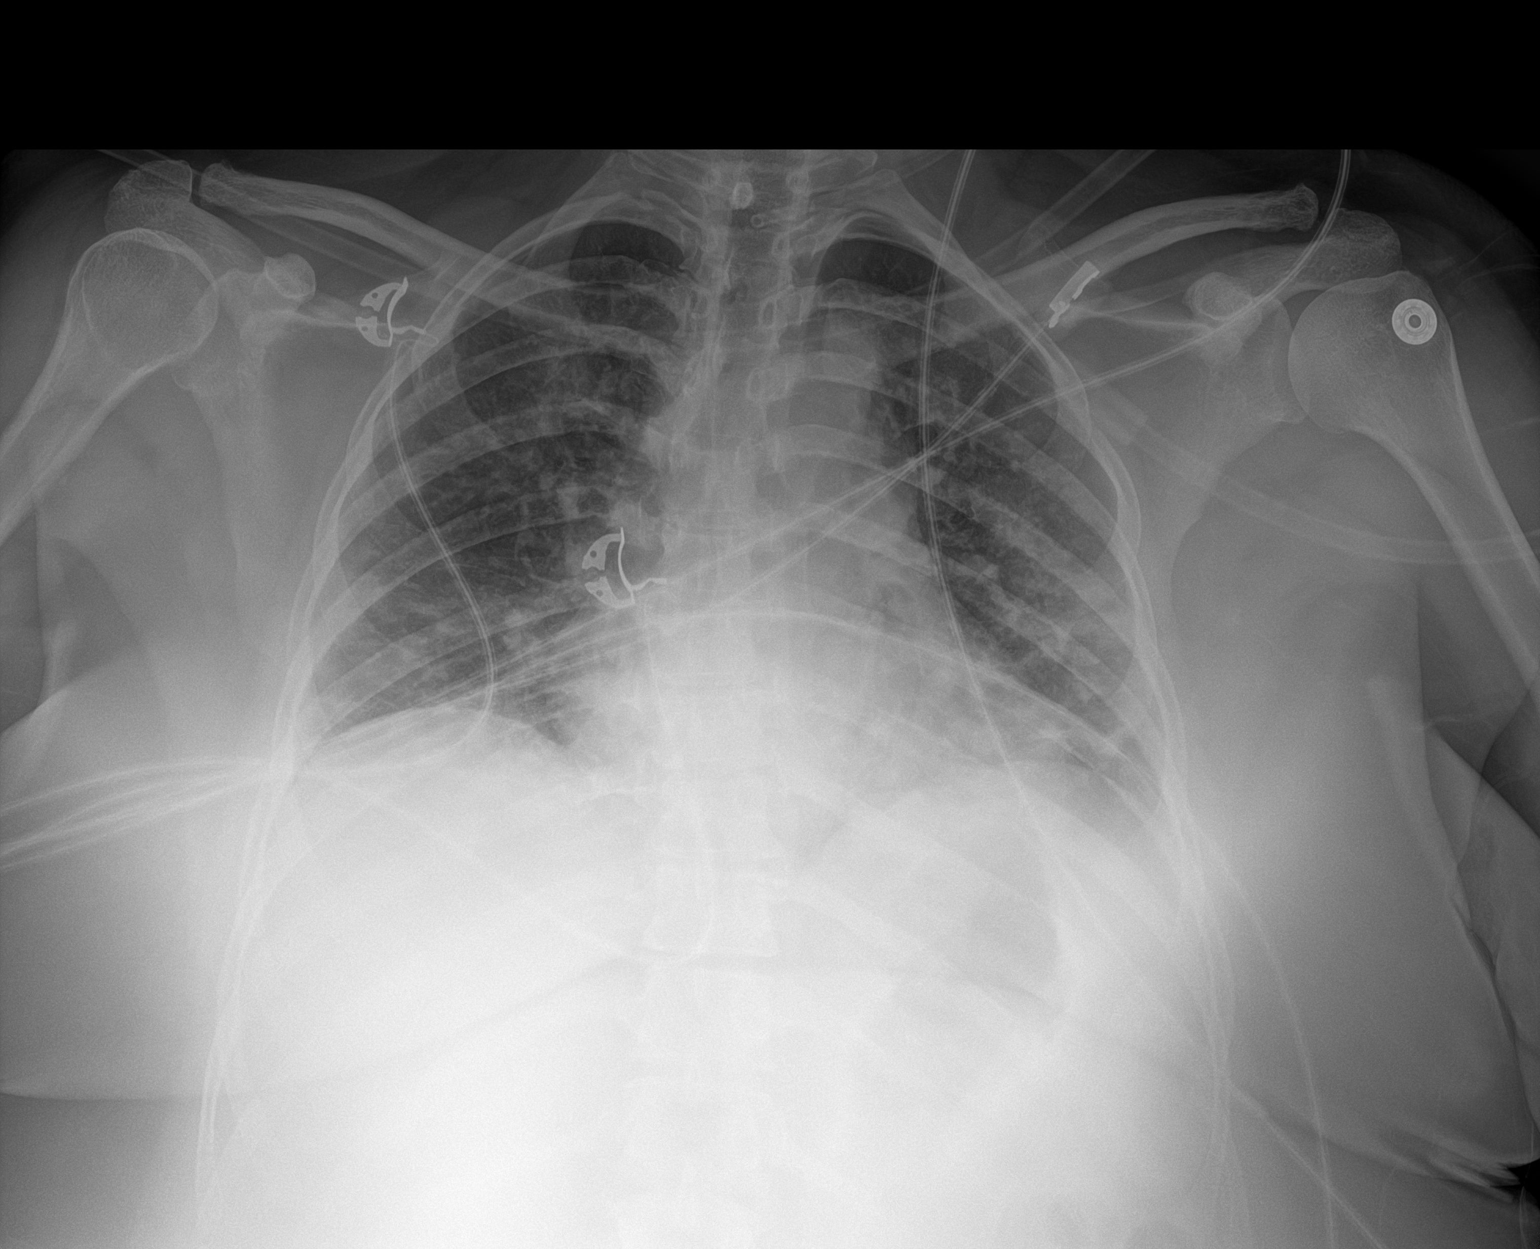

[1 of 1 positions shown; findings below may reference images not displayed]

FINDINGS: Mild patchy opacities in the bases, left greater than right. No
pneumothorax. The cardiomediastinal silhouette is unremarkable. No
other acute abnormalities.
IMPRESSION: Mild patchy opacities in the bases, left greater than right, may
represent developing pneumonia. Atypical infections should be
considered. No other acute abnormalities are identified.

## 2022-12-08 LAB — CMP14+EGFR: EGFR: 109

## 2022-12-30 ENCOUNTER — Encounter: Payer: Self-pay | Admitting: Nurse Practitioner

## 2022-12-30 ENCOUNTER — Ambulatory Visit (INDEPENDENT_AMBULATORY_CARE_PROVIDER_SITE_OTHER): Payer: Self-pay | Admitting: Nurse Practitioner

## 2022-12-30 VITALS — BP 120/80 | HR 86 | Ht 60.0 in | Wt 213.0 lb

## 2022-12-30 DIAGNOSIS — R7989 Other specified abnormal findings of blood chemistry: Secondary | ICD-10-CM

## 2022-12-30 NOTE — Patient Instructions (Addendum)
Reducir pan, pasta, patatas, arroz y dulces.  Haga ejercicio de 30 a 45 minutos al da y pierda peso para reducir el riesgo de enfermedad del hgado graso.  Seguimiento en 1 mes- 6 semanas.  Due to recent changes in healthcare laws, you may see the results of your imaging and laboratory studies on MyChart before your provider has had a chance to review them.  We understand that in some cases there may be results that are confusing or concerning to you. Not all laboratory results come back in the same time frame and the provider may be waiting for multiple results in order to interpret others.  Please give [place] 48 hours in order for your provider to thoroughly review all the results before contacting the office for clarification of your results.   Thank you for trusting me with your gastrointestinal care!   Alcide Evener, CRNP

## 2022-12-30 NOTE — Progress Notes (Signed)
12/30/2022 Maeola Holdridge [id number] Aug 05, 1975    CHIEF COMPLAINT: Hepatic steatosis   HISTORY OF PRESENT ILLNESS: Vonya Adduci. Bynog is a 47 year old female with a past medical history of asthma, DM type II and obesity. She presents to our office today as referred by Dr. Toney Reil for further evaluation regarding hepatic steatosis. She speaks Spanish therefore she is accompanied by a [place] Spanish interpreter to facilitate communication throughout today's office visit. She denies having any nausea or vomiting. No GERD symptoms of dysphagia. No abdominal pain. No bloody or black stools. Never had a screening colonoscopy. Weight has been stable.  No alcohol use.  No drug use.  Non-smoker.  No known family history of liver disease.  She presents presents without recent laboratory studies which show elevated liver enzymes.  She endorsed undergoing an abdominal sonogram approximately 1 year ago, results are not in epic or Care Everywhere.  She is uninsured.     Latest Ref Rng & Units 09/15/2018    3:55 AM 09/14/2018    3:00 AM 09/13/2018    2:10 AM  Hepatic Function  Total Protein 6.5 - 8.1 g/dL 6.5  6.6  6.6   Albumin 3.5 - 5.0 g/dL 3.2  3.1  2.9   AST 15 - 41 U/L 55  45  37   ALT 0 - 44 U/L 73  61  58   Alk Phosphatase 38 - 126 U/L 42  42  42   Total Bilirubin 0.3 - 1.2 mg/dL 0.4  0.4  0.4        Latest Ref Rng & Units 01/17/2019    5:05 PM 09/15/2018    3:55 AM 09/14/2018    3:00 AM  CMP  Glucose 65 - 99 mg/dL 010  272  536   BUN 6 - 24 mg/dL 12  16  15    Creatinine 0.57 - 1.00 mg/dL 6.44  0.34  7.42   Sodium 134 - 144 mmol/L 141  135  138   Potassium 3.5 - 5.2 mmol/L 4.1  3.7  3.6   Chloride 96 - 106 mmol/L 102  99  103   CO2 20 - 29 mmol/L 20  26  26    Calcium 8.7 - 10.2 mg/dL 9.4  8.3  8.5   Total Protein 6.5 - 8.1 g/dL  6.5  6.6   Total Bilirubin 0.3 - 1.2 mg/dL  0.4  0.4   Alkaline Phos 38 - 126 U/L  42  42   AST 15 - 41 U/L  55  45   ALT 0 - 44 U/L  73   61        Latest Ref Rng & Units 01/17/2019    5:05 PM 09/15/2018    3:55 AM 09/14/2018    3:00 AM  CBC  WBC 3.4 - 10.8 x10E3/uL 8.8  13.1  13.0   Hemoglobin 11.1 - 15.9 g/dL 59.5  63.8  75.6   Hematocrit 34.0 - 46.6 % 40.4  37.5  35.6   Platelets 150 - 450 x10E3/uL 280  375  387     Social History: She is married. She has one son and one daughter.   Family History: Mother with history of diabetes.  No known family history of esophageal, gastric or colorectal cancer.  No family history of liver disease.  No Known Allergies   Outpatient Encounter Medications as of 12/30/2022  Medication Sig   blood glucose meter kit and supplies Dispense based  on patient and insurance preference. Use up to four times daily as directed. (FOR ICD-10 E10.9, E11.9). (Patient not taking: Reported on 03/22/2019)   blood glucose meter kit and supplies Dispense based on patient and insurance preference. Use up to four times daily as directed. (FOR ICD-10 E10.9, E11.9). (Patient not taking: Reported on 03/22/2019)   insulin NPH-regular Human (70-30) 100 UNIT/ML injection Inject 12 Units into the skin 2 (two) times daily with a meal. Please provide 2 months supply of appropriate needles and syringes as well. (Patient not taking: Reported on 03/22/2019)   metFORMIN (GLUCOPHAGE) 500 MG tablet Take 1 tablet (500 mg total) by mouth 2 (two) times daily. (Patient not taking: Reported on 03/22/2019)   montelukast (SINGULAIR) 10 MG tablet Take 10 mg by mouth at bedtime.   norgestimate-ethinyl estradiol (ORTHO-CYCLEN) 0.25-35 MG-MCG tablet Take 1 tablet by mouth daily.   No facility-administered encounter medications on file as of 12/30/2022.   REVIEW OF SYSTEMS:  Gen: Denies fever, sweats or chills. No weight loss.  CV: Denies chest pain, palpitations or edema. Resp: Denies cough, shortness of breath of hemoptysis.  GI: Denies heartburn, dysphagia, stomach or lower abdominal pain. No diarrhea or constipation.  GU: Denies  urinary burning, blood in urine, increased urinary frequency or incontinence. MS: Denies joint pain, muscles aches or weakness. Derm: Denies rash, itchiness, skin lesions or unhealing ulcers. Psych: Denies depression, anxiety, memory loss or confusion. Heme: Denies bruising, easy bleeding. Neuro:  Denies headaches, dizziness or paresthesias. Endo:  + Dm type II.  PHYSICAL EXAM: BP 120/80 (BP Location: Left Arm, Patient Position: Sitting, Cuff Size: Normal)   Pulse 86   Ht 5' (1.524 m)   Wt 213 lb (96.6 kg)   SpO2 95%   BMI 41.60 kg/m   General: 47 year old female in no acute distress. Head: Normocephalic and atraumatic. Eyes:  Sclerae non-icteric, conjunctive pink. Ears: Normal auditory acuity. Mouth: Dentition intact. No ulcers or lesions.  Neck: Supple, no lymphadenopathy or thyromegaly.  Lungs: Clear bilaterally to auscultation without wheezes, crackles or rhonchi. Heart: Regular rate and rhythm. No murmur, rub or gallop appreciated.  Abdomen: Soft, nontender, nondistended. No masses. No hepatosplenomegaly. Normoactive bowel sounds x 4 quadrants.  Rectal: Deferred. Musculoskeletal: Symmetrical with no gross deformities. Skin: Warm and dry. No rash or lesions on visible extremities. Extremities: No edema. Neurological: Alert oriented x 4, no focal deficits.  Psychological:  Alert and cooperative. Normal mood and affect.  ASSESSMENT AND PLAN:  47 year old female with history of elevated LFTs.  Abdominal imaging reportedly showed hepatic steatosis. -Request copy of most recent LFTs,  hepatitis A/B/C serologies if done and abdominal sonogram results from PCP -Advised patient to apply for [place] financial assistance -Patient encouraged to reduce the carbohydrates in her diet i.e. reduce bread/pasta/rice/potatoes/tortillas and sweets, exercise as tolerated and lose weight to reduce the risk of developing fatty liver disease -Follow-up in 2 months  Obesity -Refer patient to  [facility] as patient has [place] financial assistance -See recommendations above  Diabetes mellitus type 2, on Metformin and Glimepiride.  No longer taking insulin.  Colon cancer screening -Screening colonoscopy recommended as the patient is 47 years old, denies ever having a screening colonoscopy -To discuss further at the time of her follow-up appointment, hopefully she will have [place] Financial assistance at that time    CC:  Shelby Mattocks, DO

## 2023-01-03 NOTE — Progress Notes (Signed)
____________________________________________________________  Attending physician addendum:  Thank you for sending this case to me. I have reviewed the entire note and agree with the plan.  When we receive recent PCP labs, please be sure there is a CBC.  If not, then we need one to calculate a FIB4 score to decide if elastography is needed.  Amada Jupiter, MD  ____________________________________________________________

## 2023-01-12 NOTE — Progress Notes (Signed)
Dr. Myrtie Neither, I received a copy of the patient's labs as follows:  Labs 12/07/2022: Total bili 0.3.  Alk phos 88.  AST 23.  ALT 38 ( 0 - 32).  Labs 05/12/2022: WBC 6.1.  Hemoglobin 12.4.  Hematocrit 38.1.  Platelet 246.  Per your addendum note, you requested  CBC result in order to calculate a fib-4  level. However, AST/ALT levels were not done on the same date of his CBC/PLT count.   I will send patient to our lab for a repeat hepatic panel and CBC unless you suggest otherwise.

## 2023-01-14 NOTE — Progress Notes (Signed)
Patient has office follow-up appoint with me 03/28/2023, I will do so she is made progress regarding diet modifications and weight loss.  I will advise her to continue further follow-up with her primary physician with annual LFTs at that time.

## 2023-01-14 NOTE — Progress Notes (Signed)
CBC from March of this year is sufficient, so we do not need to send her for another one now.  Fib4 = 0.73, so elastography not needed.    Focus on diet/lifestyle changes as you outlined and have LFTs checked annually by PCP, sending patient back to [place] if there is a significant change.  - H. Danis

## 2023-01-18 ENCOUNTER — Telehealth: Payer: Self-pay | Admitting: Pharmacy Technician

## 2023-01-18 NOTE — Telephone Encounter (Signed)
Pharmacy Patient Advocate Encounter   We received a release of information form in the PA faxes. Indexed to the media tab.

## 2023-03-28 ENCOUNTER — Ambulatory Visit: Payer: Self-pay | Admitting: Nurse Practitioner

## 2023-03-28 NOTE — Progress Notes (Deleted)
     03/28/2023 Ruth Fox [id number] 06/10/1975   Chief Complaint: Fatty liver   History of Present Illness: Ruth Fox. Ruth Fox  is a 48 year old female with a past medical history of asthma, DM type II and obesity.       Current Medications, Allergies, Past Medical History, Past Surgical History, Family History and Social History were reviewed in [employer] record.   Review of Systems:   Constitutional: Negative for fever, sweats, chills or weight loss.  Respiratory: Negative for shortness of breath.   Cardiovascular: Negative for chest pain, palpitations and leg swelling.  Gastrointestinal: See HPI.  Musculoskeletal: Negative for back pain or muscle aches.  Neurological: Negative for dizziness, headaches or paresthesias.    Physical Exam: There were no vitals taken for this visit. General: in no acute distress. Head: Normocephalic and atraumatic. Eyes: No scleral icterus. Conjunctiva pink . Ears: Normal auditory acuity. Mouth: Dentition intact. No ulcers or lesions.  Lungs: Clear throughout to auscultation. Heart: Regular rate and rhythm, no murmur. Abdomen: Soft, nontender and nondistended. No masses or hepatomegaly. Normal bowel sounds x 4 quadrants.  Rectal: Deferred.  Musculoskeletal: Symmetrical with no gross deformities. Extremities: No edema. Neurological: Alert oriented x 4. No focal deficits.  Psychological: Alert and cooperative. Normal mood and affect  Assessment and Recommendations:  48 year old female with history of elevated LFTs.  Abdominal imaging reportedly showed hepatic steatosis. -Request copy of most recent LFTs,  hepatitis A/B/C serologies if done and abdominal sonogram results from PCP -Advised patient to apply for [place] financial assistance -Patient encouraged to reduce the carbohydrates in her diet i.e. reduce bread/pasta/rice/potatoes/tortillas and sweets, exercise as tolerated and lose weight to reduce  the risk of developing fatty liver disease -Follow-up in 2 months   Obesity    Diabetes mellitus type 2, on Metformin and Glimepiride.  No longer taking insulin.   Colon cancer screening -Screening colonoscopy recommended as the patient is 48 years old, denies ever having a screening colonoscopy

## 2023-08-23 ENCOUNTER — Encounter (HOSPITAL_COMMUNITY): Payer: Self-pay | Admitting: Emergency Medicine

## 2023-08-23 ENCOUNTER — Emergency Department (HOSPITAL_COMMUNITY)
Admission: EM | Admit: 2023-08-23 | Discharge: 2023-08-23 | Discharge status: Against Medical Advice | Payer: Self-pay | Attending: Emergency Medicine | Admitting: Emergency Medicine

## 2023-08-23 ENCOUNTER — Other Ambulatory Visit: Payer: Self-pay

## 2023-08-23 DIAGNOSIS — Z5321 Procedure and treatment not carried out due to patient leaving prior to being seen by health care provider: Secondary | ICD-10-CM | POA: Insufficient documentation

## 2023-08-23 DIAGNOSIS — T63461A Toxic effect of venom of wasps, accidental (unintentional), initial encounter: Secondary | ICD-10-CM | POA: Insufficient documentation

## 2023-08-23 NOTE — ED Notes (Signed)
 Called patient no answer.

## 2023-08-23 NOTE — ED Triage Notes (Signed)
 Patient reports she was stung by yellow jackets around 1700 all over her body.  Patient denies sob, or problems swallowing, endorses abdominal pain where she was stung. Patient gives verbal consent for MSE.
